# Patient Record
Sex: Male | Born: 1970 | Race: White | Hispanic: No | Marital: Single | Smoking: Former smoker
Health system: Southern US, Community
[De-identification: ages and names within clinical notes are randomized; demographics above are authoritative.]

## PROBLEM LIST (undated history)

## (undated) DIAGNOSIS — K602 Anal fissure, unspecified: Secondary | ICD-10-CM

## (undated) DIAGNOSIS — F419 Anxiety disorder, unspecified: Secondary | ICD-10-CM

## (undated) HISTORY — PX: SPHINCTEROTOMY: SHX5279

## (undated) HISTORY — DX: Anal fissure, unspecified: K60.2

## (undated) HISTORY — PX: CYST REMOVAL NECK: SHX6281

---

## 2002-08-20 ENCOUNTER — Ambulatory Visit (HOSPITAL_BASED_OUTPATIENT_CLINIC_OR_DEPARTMENT_OTHER): Admission: RE | Admit: 2002-08-20 | Discharge: 2002-08-20 | Payer: Self-pay | Admitting: *Deleted

## 2002-08-20 ENCOUNTER — Encounter (INDEPENDENT_AMBULATORY_CARE_PROVIDER_SITE_OTHER): Payer: Self-pay | Admitting: Specialist

## 2010-09-06 ENCOUNTER — Ambulatory Visit (HOSPITAL_BASED_OUTPATIENT_CLINIC_OR_DEPARTMENT_OTHER): Admission: RE | Admit: 2010-09-06 | Discharge: 2010-09-06 | Payer: Self-pay | Admitting: General Surgery

## 2011-02-28 LAB — POCT HEMOGLOBIN-HEMACUE: Hemoglobin: 16.2 g/dL (ref 13.0–17.0)

## 2011-05-03 NOTE — Op Note (Signed)
   NAME:  [name], [name]                         ACCOUNT NO.:  [id number]   MEDICAL RECORD NO.:  [id number]                   PATIENT TYPE:  AMB   LOCATION:  DSC                                  FACILITY:  [facility]   PHYSICIAN:  Kathy Breach, M.D.                   DATE OF BIRTH:  09/15/1971   DATE OF PROCEDURE:  DATE OF DISCHARGE:  08/20/2002                                 OPERATIVE REPORT   PREOPERATIVE DIAGNOSIS:  Three cm posterior cervical midline subcutaneous  lipoma.   OPERATIVE PROCEDURE:  Excision of posterior cervical midline 3 to 4  sebaceous cyst.   POSTOPERATIVE DIAGNOSIS:  Pending histological confirmation.   DESCRIPTION OF PROCEDURE:  With the patient under general orotracheal  anesthesia he was placed in the prone position. The posterior neck and upper  back were prepped and draped in sterile fashion.  The patient had a freely  mobile, fairly soft 3+ cm subcutaneous mass in the midline of the neck  overlying approximately C6.  A horizontal incision in the skin lines 4 to 5  cm was made over the mid portion of the mass.  The skin incision was through  the very thick posterior cervical skin and immediately ruptured into an  inflammatory sebaceous cyst.  With sharp dissection entire cyst was  dissected from the surrounding tissues.  There was a fair amount of fibrotic  inflammatory response adjacent to the cyst wall and multiple bleeders  controlled with electrocautery and 4-0 silk ties as needed. With the cyst  entirely excised the wound was irrigated diffusely.  Complete hemostasis and  suction using cauterization. The deep layer was closed with 3-0 chromic  catgut suture.  The subcutaneous tissues were closed with interrupted 3-0  chromic catgut sutures.  The skin was approximated with a running 4-0 nylon  suture. The skin margins were painted with Benzoin and Steri-Strips were  applied.  Blood loss for the procedure was estimated at 150 cc.  The patient  tolerated  the procedure well and was taken to the recovery room in stable,  general condition.                                               Kathy Breach, M.D.    Venia Minks  D:  08/20/2002  T:  08/22/2002  Job:  [id number]

## 2012-03-26 ENCOUNTER — Other Ambulatory Visit: Payer: Self-pay | Admitting: Family Medicine

## 2012-03-26 DIAGNOSIS — N50819 Testicular pain, unspecified: Secondary | ICD-10-CM

## 2012-03-30 ENCOUNTER — Other Ambulatory Visit: Payer: Self-pay

## 2012-04-02 ENCOUNTER — Ambulatory Visit
Admission: RE | Admit: 2012-04-02 | Discharge: 2012-04-02 | Disposition: A | Discharge status: Home / Self Care | Payer: BC Managed Care – PPO | Source: Ambulatory Visit | Attending: Family Medicine | Admitting: Family Medicine

## 2012-04-02 DIAGNOSIS — N50819 Testicular pain, unspecified: Secondary | ICD-10-CM

## 2012-05-19 ENCOUNTER — Other Ambulatory Visit (HOSPITAL_COMMUNITY): Payer: Self-pay | Admitting: Neurosurgery

## 2012-05-19 DIAGNOSIS — M5126 Other intervertebral disc displacement, lumbar region: Secondary | ICD-10-CM

## 2012-05-22 ENCOUNTER — Ambulatory Visit (HOSPITAL_COMMUNITY)
Admission: RE | Admit: 2012-05-22 | Discharge: 2012-05-22 | Disposition: A | Discharge status: Home / Self Care | Payer: BC Managed Care – PPO | Source: Ambulatory Visit | Attending: Neurosurgery | Admitting: Neurosurgery

## 2012-05-22 DIAGNOSIS — M545 Low back pain, unspecified: Secondary | ICD-10-CM | POA: Insufficient documentation

## 2012-05-22 DIAGNOSIS — M5126 Other intervertebral disc displacement, lumbar region: Secondary | ICD-10-CM | POA: Insufficient documentation

## 2014-02-27 ENCOUNTER — Encounter (HOSPITAL_BASED_OUTPATIENT_CLINIC_OR_DEPARTMENT_OTHER): Payer: Self-pay | Admitting: Emergency Medicine

## 2014-02-27 ENCOUNTER — Emergency Department (HOSPITAL_BASED_OUTPATIENT_CLINIC_OR_DEPARTMENT_OTHER)
Admission: EM | Admit: 2014-02-27 | Discharge: 2014-02-27 | Disposition: A | Discharge status: Home / Self Care | Payer: Worker's Compensation | Attending: Emergency Medicine | Admitting: Emergency Medicine

## 2014-02-27 DIAGNOSIS — S139XXA Sprain of joints and ligaments of unspecified parts of neck, initial encounter: Secondary | ICD-10-CM | POA: Insufficient documentation

## 2014-02-27 DIAGNOSIS — F411 Generalized anxiety disorder: Secondary | ICD-10-CM | POA: Insufficient documentation

## 2014-02-27 DIAGNOSIS — Z79899 Other long term (current) drug therapy: Secondary | ICD-10-CM | POA: Insufficient documentation

## 2014-02-27 DIAGNOSIS — Z87891 Personal history of nicotine dependence: Secondary | ICD-10-CM | POA: Insufficient documentation

## 2014-02-27 DIAGNOSIS — Y929 Unspecified place or not applicable: Secondary | ICD-10-CM | POA: Insufficient documentation

## 2014-02-27 DIAGNOSIS — Z880 Allergy status to penicillin: Secondary | ICD-10-CM | POA: Insufficient documentation

## 2014-02-27 DIAGNOSIS — M542 Cervicalgia: Secondary | ICD-10-CM | POA: Diagnosis present

## 2014-02-27 DIAGNOSIS — S161XXA Strain of muscle, fascia and tendon at neck level, initial encounter: Secondary | ICD-10-CM

## 2014-02-27 DIAGNOSIS — IMO0002 Reserved for concepts with insufficient information to code with codable children: Secondary | ICD-10-CM | POA: Insufficient documentation

## 2014-02-27 DIAGNOSIS — Y939 Activity, unspecified: Secondary | ICD-10-CM | POA: Insufficient documentation

## 2014-02-27 HISTORY — DX: Anxiety disorder, unspecified: F41.9

## 2014-02-27 MED ORDER — CYCLOBENZAPRINE HCL 5 MG PO TABS: 5.0000 mg | ORAL_TABLET | Freq: Three times a day (TID) | ORAL | Status: DC | PRN

## 2014-02-27 MED ORDER — NAPROXEN 500 MG PO TABS: 500.0000 mg | ORAL_TABLET | Freq: Two times a day (BID) | ORAL | Status: DC
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
## 2014-02-27 NOTE — Discharge Instructions (Signed)
Cervical Sprain A cervical sprain is an injury in the neck in which the strong, fibrous tissues (ligaments) that connect your neck bones stretch or tear. Cervical sprains can range from mild to severe. Severe cervical sprains can cause the neck vertebrae to be unstable. This can lead to damage of the spinal cord and can result in serious nervous system problems. The amount of time it takes for a cervical sprain to get better depends on the cause and extent of the injury. Most cervical sprains heal in 1 to 3 weeks. CAUSES  Severe cervical sprains may be caused by:   Contact sport injuries (such as from football, rugby, wrestling, hockey, auto racing, gymnastics, diving, martial arts, or boxing).   Motor vehicle collisions.   Whiplash injuries. This is an injury from a sudden forward-and backward whipping movement of the head and neck.  Falls.  Mild cervical sprains may be caused by:   Being in an awkward position, such as while cradling a telephone between your ear and shoulder.   Sitting in a chair that does not offer proper support.   Working at a poorly designed computer station.   Looking up or down for long periods of time.  SYMPTOMS   Pain, soreness, stiffness, or a burning sensation in the front, back, or sides of the neck. This discomfort may develop immediately after the injury or slowly, 24 hours or more after the injury.   Pain or tenderness directly in the middle of the back of the neck.   Shoulder or upper back pain.   Limited ability to move the neck.   Headache.   Dizziness.   Weakness, numbness, or tingling in the hands or arms.   Muscle spasms.   Difficulty swallowing or chewing.   Tenderness and swelling of the neck.  DIAGNOSIS  Most of the time your health care provider can diagnose a cervical sprain by taking your history and doing a physical exam. Your health care provider will ask about previous neck injuries and any known neck  problems, such as arthritis in the neck. X-rays may be taken to find out if there are any other problems, such as with the bones of the neck. Other tests, such as a CT scan or MRI, may also be needed.  TREATMENT  Treatment depends on the severity of the cervical sprain. Mild sprains can be treated with rest, keeping the neck in place (immobilization), and pain medicines. Severe cervical sprains are immediately immobilized. Further treatment is done to help with pain, muscle spasms, and other symptoms and may include:  Medicines, such as pain relievers, numbing medicines, or muscle relaxants.   Physical therapy. This may involve stretching exercises, strengthening exercises, and posture training. Exercises and improved posture can help stabilize the neck, strengthen muscles, and help stop symptoms from returning.  HOME CARE INSTRUCTIONS   Put ice on the injured area.   Put ice in a plastic bag.   Place a towel between your skin and the bag.   Leave the ice on for 15 20 minutes, 3 4 times a day.   If your injury was severe, you may have been given a cervical collar to wear. A cervical collar is a two-piece collar designed to keep your neck from moving while it heals.  Do not remove the collar unless instructed by your health care provider.  If you have long hair, keep it outside of the collar.  Ask your health care provider before making any adjustments to your collar.   Minor adjustments may be required over time to improve comfort and reduce pressure on your chin or on the back of your head.  Ifyou are allowed to remove the collar for cleaning or bathing, follow your health care provider's instructions on how to do so safely.  Keep your collar clean by wiping it with mild soap and water and drying it completely. If the collar you have been given includes removable pads, remove them every 1 2 days and hand wash them with soap and water. Allow them to air dry. They should be completely  dry before you wear them in the collar.  If you are allowed to remove the collar for cleaning and bathing, wash and dry the skin of your neck. Check your skin for irritation or sores. If you see any, tell your health care provider.  Do not drive while wearing the collar.   Only take over-the-counter or prescription medicines for pain, discomfort, or fever as directed by your health care provider.   Keep all follow-up appointments as directed by your health care provider.   Keep all physical therapy appointments as directed by your health care provider.   Make any needed adjustments to your workstation to promote good posture.   Avoid positions and activities that make your symptoms worse.   Warm up and stretch before being active to help prevent problems.  SEEK MEDICAL CARE IF:   Your pain is not controlled with medicine.   You are unable to decrease your pain medicine over time as planned.   Your activity level is not improving as expected.  SEEK IMMEDIATE MEDICAL CARE IF:   You develop any bleeding.  You develop stomach upset.  You have signs of an allergic reaction to your medicine.   Your symptoms get worse.   You develop new, unexplained symptoms.   You have numbness, tingling, weakness, or paralysis in any part of your body.  MAKE SURE YOU:   Understand these instructions.  Will watch your condition.  Will get help right away if you are not doing well or get worse. Document Released: 09/29/2007 Document Revised: 09/22/2013 Document Reviewed: 06/09/2013 ExitCare Patient Information 2014 [employer].  

## 2014-05-19 ENCOUNTER — Emergency Department (HOSPITAL_BASED_OUTPATIENT_CLINIC_OR_DEPARTMENT_OTHER)
Admission: EM | Admit: 2014-05-19 | Discharge: 2014-05-19 | Disposition: A | Discharge status: Home / Self Care | Payer: Worker's Compensation | Attending: Emergency Medicine | Admitting: Emergency Medicine

## 2014-05-19 ENCOUNTER — Encounter (HOSPITAL_BASED_OUTPATIENT_CLINIC_OR_DEPARTMENT_OTHER): Payer: Self-pay | Admitting: Emergency Medicine

## 2014-05-19 DIAGNOSIS — Y9229 Other specified public building as the place of occurrence of the external cause: Secondary | ICD-10-CM | POA: Insufficient documentation

## 2014-05-19 DIAGNOSIS — Z87891 Personal history of nicotine dependence: Secondary | ICD-10-CM | POA: Insufficient documentation

## 2014-05-19 DIAGNOSIS — IMO0002 Reserved for concepts with insufficient information to code with codable children: Secondary | ICD-10-CM | POA: Insufficient documentation

## 2014-05-19 DIAGNOSIS — F411 Generalized anxiety disorder: Secondary | ICD-10-CM | POA: Insufficient documentation

## 2014-05-19 DIAGNOSIS — Z79899 Other long term (current) drug therapy: Secondary | ICD-10-CM | POA: Insufficient documentation

## 2014-05-19 DIAGNOSIS — S39012A Strain of muscle, fascia and tendon of lower back, initial encounter: Secondary | ICD-10-CM

## 2014-05-19 DIAGNOSIS — Z791 Long term (current) use of non-steroidal anti-inflammatories (NSAID): Secondary | ICD-10-CM | POA: Insufficient documentation

## 2014-05-19 DIAGNOSIS — Y9389 Activity, other specified: Secondary | ICD-10-CM | POA: Insufficient documentation

## 2014-05-19 DIAGNOSIS — X500XXA Overexertion from strenuous movement or load, initial encounter: Secondary | ICD-10-CM | POA: Insufficient documentation

## 2014-05-19 MED ORDER — CYCLOBENZAPRINE HCL 10 MG PO TABS: 10.0000 mg | ORAL_TABLET | Freq: Three times a day (TID) | ORAL | Status: DC | PRN

## 2014-05-19 NOTE — Discharge Instructions (Signed)
Ibuprofen 600 mg 3 times daily for the next 5 days. Flexeril as needed for pain not relieved with ibuprofen.  Followup with your primary Dr. if not improving in the next week, and return to the emergency department if your symptoms significantly worsen or if you develop any new and concerning symptoms.   Back Pain, Adult Low back pain is very common. About 1 in 5 people have back pain.The cause of low back pain is rarely dangerous. The pain often gets better over time.About half of people with a sudden onset of back pain feel better in just 2 weeks. About 8 in 10 people feel better by 6 weeks.  CAUSES Some common causes of back pain include:  Strain of the muscles or ligaments supporting the spine.  Wear and tear (degeneration) of the spinal discs.  Arthritis.  Direct injury to the back. DIAGNOSIS Most of the time, the direct cause of low back pain is not known.However, back pain can be treated effectively even when the exact cause of the pain is unknown.Answering your caregiver's questions about your overall health and symptoms is one of the most accurate ways to make sure the cause of your pain is not dangerous. If your caregiver needs more information, he or she may order lab work or imaging tests (X-rays or MRIs).However, even if imaging tests show changes in your back, this usually does not require surgery. HOME CARE INSTRUCTIONS For many people, back pain returns.Since low back pain is rarely dangerous, it is often a condition that people can learn to [facility] their own.   Remain active. It is stressful on the back to sit or stand in one place. Do not sit, drive, or stand in one place for more than 30 minutes at a time. Take short walks on level surfaces as soon as pain allows.Try to increase the length of time you walk each day.  Do not stay in bed.Resting more than 1 or 2 days can delay your recovery.  Do not avoid exercise or work.Your body is made to move.It is not  dangerous to be active, even though your back may hurt.Your back will likely heal faster if you return to being active before your pain is gone.  Pay attention to your body when you bend and lift. Many people have less discomfortwhen lifting if they bend their knees, keep the load close to their bodies,and avoid twisting. Often, the most comfortable positions are those that put less stress on your recovering back.  Find a comfortable position to sleep. Use a firm mattress and lie on your side with your knees slightly bent. If you lie on your back, put a pillow under your knees.  Only take over-the-counter or prescription medicines as directed by your caregiver. Over-the-counter medicines to reduce pain and inflammation are often the most helpful.Your caregiver may prescribe muscle relaxant drugs.These medicines help dull your pain so you can more quickly return to your normal activities and healthy exercise.  Put ice on the injured area.  Put ice in a plastic bag.  Place a towel between your skin and the bag.  Leave the ice on for 15-20 minutes, 03-04 times a day for the first 2 to 3 days. After that, ice and heat may be alternated to reduce pain and spasms.  Ask your caregiver about trying back exercises and gentle massage. This may be of some benefit.  Avoid feeling anxious or stressed.Stress increases muscle tension and can worsen back pain.It is important to recognize when  you are anxious or stressed and learn ways to manage it.Exercise is a great option. SEEK MEDICAL CARE IF:  You have pain that is not relieved with rest or medicine.  You have pain that does not improve in 1 week.  You have new symptoms.  You are generally not feeling well. SEEK IMMEDIATE MEDICAL CARE IF:   You have pain that radiates from your back into your legs.  You develop new bowel or bladder control problems.  You have unusual weakness or numbness in your arms or legs.  You develop nausea or  vomiting.  You develop abdominal pain.  You feel faint. Document Released: 12/02/2005 Document Revised: 06/02/2012 Document Reviewed: 04/22/2011 [facility] Patient Information 2014 [place], [place].
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
## 2015-01-16 ENCOUNTER — Encounter (HOSPITAL_BASED_OUTPATIENT_CLINIC_OR_DEPARTMENT_OTHER): Payer: Self-pay | Admitting: *Deleted

## 2015-01-16 ENCOUNTER — Emergency Department (HOSPITAL_BASED_OUTPATIENT_CLINIC_OR_DEPARTMENT_OTHER): Payer: Worker's Compensation

## 2015-01-16 ENCOUNTER — Emergency Department (HOSPITAL_BASED_OUTPATIENT_CLINIC_OR_DEPARTMENT_OTHER)
Admission: EM | Admit: 2015-01-16 | Discharge: 2015-01-16 | Disposition: A | Discharge status: Home / Self Care | Payer: Worker's Compensation | Attending: Emergency Medicine | Admitting: Emergency Medicine

## 2015-01-16 DIAGNOSIS — F419 Anxiety disorder, unspecified: Secondary | ICD-10-CM | POA: Diagnosis not present

## 2015-01-16 DIAGNOSIS — M5136 Other intervertebral disc degeneration, lumbar region: Secondary | ICD-10-CM | POA: Diagnosis not present

## 2015-01-16 DIAGNOSIS — T148XXA Other injury of unspecified body region, initial encounter: Secondary | ICD-10-CM

## 2015-01-16 DIAGNOSIS — Z79899 Other long term (current) drug therapy: Secondary | ICD-10-CM | POA: Diagnosis not present

## 2015-01-16 DIAGNOSIS — Y9389 Activity, other specified: Secondary | ICD-10-CM | POA: Diagnosis not present

## 2015-01-16 DIAGNOSIS — S39012A Strain of muscle, fascia and tendon of lower back, initial encounter: Secondary | ICD-10-CM | POA: Diagnosis not present

## 2015-01-16 DIAGNOSIS — Y990 Civilian activity done for income or pay: Secondary | ICD-10-CM | POA: Diagnosis not present

## 2015-01-16 DIAGNOSIS — S3992XA Unspecified injury of lower back, initial encounter: Secondary | ICD-10-CM | POA: Diagnosis present

## 2015-01-16 DIAGNOSIS — Z87891 Personal history of nicotine dependence: Secondary | ICD-10-CM | POA: Diagnosis not present

## 2015-01-16 DIAGNOSIS — Y9289 Other specified places as the place of occurrence of the external cause: Secondary | ICD-10-CM | POA: Diagnosis not present

## 2015-01-16 MED ORDER — CYCLOBENZAPRINE HCL 5 MG PO TABS: 5.0000 mg | ORAL_TABLET | Freq: Two times a day (BID) | ORAL | Status: DC | PRN

## 2015-01-16 MED ORDER — HYDROCODONE-ACETAMINOPHEN 5-325 MG PO TABS: 1.0000 | ORAL_TABLET | ORAL | Status: DC | PRN
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
## 2015-01-16 NOTE — Discharge Instructions (Signed)
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
## 2018-09-11 ENCOUNTER — Other Ambulatory Visit: Payer: Self-pay | Admitting: Family Medicine

## 2018-09-11 ENCOUNTER — Ambulatory Visit
Admission: RE | Admit: 2018-09-11 | Discharge: 2018-09-11 | Disposition: A | Discharge status: Home / Self Care | Payer: BC Managed Care – PPO | Source: Ambulatory Visit | Attending: Family Medicine | Admitting: Family Medicine

## 2018-09-11 DIAGNOSIS — R319 Hematuria, unspecified: Secondary | ICD-10-CM

## 2018-09-11 DIAGNOSIS — R1031 Right lower quadrant pain: Secondary | ICD-10-CM

## 2018-10-05 ENCOUNTER — Telehealth: Payer: Self-pay | Admitting: Hematology and Oncology

## 2018-10-05 NOTE — Telephone Encounter (Signed)
New referral received from Dr. Leodis Sias for macrocytosis. Pt has been scheduled to see Dr. Caron Presume on 10/23 at 230pm. Pt aware to arrive 30 minutes early.

## 2018-10-06 NOTE — Progress Notes (Signed)
[facility] Outpatient Hematology/Oncology Initial Consultation  Patient Name:  Anthony Kline  DOB: 12-23-1970   Date of Service: October 07, 2018  Referring Provider: Leodis Sias, MD [address] [place], [place] [address]   Consulting Physician: Toni Arthurs, MD Hematology/Oncology  Reason for Referral: In the setting of an elevated MCV without anemia, he presents now for further diagnostic and therapeutic recommendations.  History Present Illness: Anthony Kline he is a 47 year old resident of [place] whose past medical history is significant for hypercholesterolemia; lower back pain with sciatica; attention deficit disorder; mild anxiety disorder; infrequent bleeding hemorrhoids; erectile dysfunction; a previous sphincterotomy for anal fissure nearly 10 years ago; tendinitis of the right elbow; allergic rhinitis; renal calculi; and previous alcohol dependence.  His primary care physician is Dr. Leodis Sias.  He is alone at this first visit.  Because of right-sided inguinal pain, on September 11, 2018 he underwent a CT of the abdomen and pelvis without intravenous contrast.  There was fullness identified within the right renal collecting system and right ureter without definite ureteral stone.  Those changes appear to be secondary from edema from a recently passed stone.  There was a 1 mm nonobstructing left renal calculi.  No other abnormality was noted.  Those results are detailed below.  In the process of his evaluation, a complete blood count was obtained.  The results of that complete blood count from September 27 revealed hemoglobin 15.0 hematocrit 43.5 MCV 102.1 MCH 35.3 WBC 6.2 with 64% neutrophils 29% lymphocytes 7% monocytes 1% basophil; platelets 311,000.  Anthony Kline was previously unaware of an elevated MCV which generally corresponds to an increase in size of his red blood cells.  He is a reformed alcoholic.  His last consumption of alcoholic beverages was  11 years ago.  There is no blood disorder or bleeding tendency in his immediate family.  He does not eat red meat or chicken.  He does however consume seafood.  In addition to his standard medications, he takes a multivitamin, fish oil and vitamin B complex.  He admits to infrequent bright red blood when wiping.  There is no blood mixed with stool.  This happens a few times yearly.  In childhood he had a colonoscopy for "stomach problems."  He admits to having internal hemorrhoids.  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  He has no children.  He stopped smoking 2 years ago. He has no diabetes mellitus, primary hypertension, coronary artery disease, angina, or cardiac dysrhythmia.  He denies seizure disorder or stroke syndrome.  He has no thyroid disease.  There is no gastroesophageal reflux or peptic ulcer disease.  He denies viral hepatitis, inflammatory bowel disease, or diverticulosis.  He has no rheumatoid or gouty arthritis.  He denies peripheral arterial or venous thromboembolic disease.  He denies any appetite or weight deficit.  He has occasional positional dizziness.    There is no unusual headache, lightheadedness, syncope, or near syncopal episode. There is no visual changes or hearing deficit.  He denies any rash or itching.  He has no unusual cough, sore throat, orthopnea.  He denies pain or difficulty in swallowing.  No fever, shaking chills, sweats, or flulike symptoms are evident.  He has no heartburn or indigestion.  No nausea, vomiting, diarrhea, or constipation are reported.  He moves his bowels every other day.  He denies melena or bright red blood per rectum.  He has very infrequent bleeding hemorrhoids.  There  is no urinary frequency, urgency, hematuria, or dysuria.  He denies swelling of his ankles.  There is no chest or abdominal pain.  He has no focal areas of bone, joint, or muscle pain.  He has no epistaxis or hemoptysis.  He reports no numbness or  tingling in the fingers or toes.  It is with this background he presents now for further diagnostic and therapeutic recommendations in the setting of an elevated MCV without anemia.  Past Medical History:  Diagnosis Date  . Anxiety   Hypercholesterolemia Antiretroviral prophylaxis (HIV) Renal calculi Chronic depression without suicidal ideation Anxiety disorder Erectile dysfunction Tendinitis of the right elbow Internal hemorrhoids Recovered alcoholic  Surgical History: Bilateral sphincterotomy for anal fissure 10 years ago.  Family History: Mother: Age 42 years: DM/advanced breast cancer. Father: Age 53 years: DM/CAD He has no brothers. He has 1 sister without major medical problems.  Social History   Socioeconomic History  . Marital status: Single    Spouse name: Not on file  . Number of children: Not on file  . Years of education: Not on file  . Highest education level: Not on file  Occupational History  . Not on file  Social Needs  . Financial resource strain: Not on file  . Food insecurity:    Worry: Not on file    Inability: Not on file  . Transportation needs:    Medical: Not on file    Non-medical: Not on file  Tobacco Use  . Smoking status: Former Smoker  Substance and Sexual Activity  . Alcohol use: No  . Drug use: No  . Sexual activity: Not on file  Lifestyle  . Physical activity:    Days per week: Not on file    Minutes per session: Not on file  . Stress: Not on file  Relationships  . Social connections:    Talks on phone: Not on file    Gets together: Not on file    Attends religious service: Not on file    Active member of club or organization: Not on file    Attends meetings of clubs or organizations: Not on file    Relationship status: Not on file  . Intimate partner violence:    Fear of current or ex partner: Not on file    Emotionally abused: Not on file    Physically abused: Not on file    Forced sexual activity: Not on file  Other  Topics Concern  . Not on file  Social History Narrative  . Not on file  Anthony Kline is single, never married. He has no children. He is an Scientist, research (physical sciences) school principal. His alcohol consumption began at the age of 21 years. He drank heavily until age 67 years. He generally consumed wine and hard liquor. He began smoking the age of 18 years. He generally smoked 1/2 pack of cigarettes daily. When drinking heavily, he smoked 2 packs of cigarettes daily. He stopped smoking at the age of 8 years. He used e-cigarettes on a short-term basis for 1 year. He reports no recreational drug use. He has no significant use of pipe, cigars, or chewing tobacco.  Transfusion History: No prior transfusion  Exposure History: He has no known exposure to toxic chemicals, radiation, or pesticides.  Allergies  Allergen Reactions  . Augmentin [Amoxicillin-Pot Clavulanate]   No food or seasonal allergies  Current Outpatient Medications on File Prior to Visit  Medication Sig  . Amphetamine-Dextroamphetamine (ADDERALL PO) Take 20 mg by  mouth. Patient reports taking 1/4 of 20mg  tablet.  Marland Kitchen b complex vitamins tablet Take 1 tablet by mouth daily.  . Emtricitabine-Tenofovir DF (TRUVADA PO) Take by mouth.  . escitalopram (LEXAPRO) 10 MG tablet Take 10 mg by mouth daily.  Marland Kitchen FIBER PO Take by mouth.  . Multiple Vitamin (MULTIVITAMIN) tablet Take 1 tablet by mouth daily.  . Omega-3 Fatty Acids (FISH OIL PO) Take by mouth.  . Tadalafil (CIALIS PO) Take by mouth.   No current facility-administered medications on file prior to visit.    Review of Systems: Constitutional: No fever, sweats, or shaking chills.  No appetite or weight deficit. Skin: No rash, scaling, sores, lumps, or jaundice. HEENT: No visual changes or hearing deficit. Pulmonary: No unusual cough, sore throat, or orthopnea.  No DOE; former smoker. Cardiovascular: No coronary artery disease, angina, or myocardial infarction.  No cardiac  dysrhythmia, essential hypertension; dyslipidemia. Gastrointestinal: No indigestion, dysphagia, abdominal pain, diarrhea, or constipation.  No change in bowel habits.  Infrequent bleeding hemorrhoids; no melena.  No nausea or vomiting.  No viral hepatitis. Genitourinary: No urinary frequency, urgency, hematuria, or dysuria; erectile dysfunction; HIV prophylaxis; renal calculi. Musculoskeletal: Right elbow tendinitis.  No arthralgias or myalgias; no joint swelling, pain, or instability. Hematologic: No bleeding tendency or easy bruisability; elevated MCV Endocrine: No intolerance to heat or cold; no thyroid disease or diabetes mellitus. Vascular: No peripheral arterial or venous thromboembolic disease. Psychological: Mild anxiety disorder and chronic depression without suicidal ideation.  Reformed alcoholic; former tobacco dependence; no mood changes. Neurological: Occasional positional dizziness.  No lightheadedness, syncope, or near syncopal episodes; no numbness or tingling in the fingers or toes.  Physical Examination: Vital Signs: Body surface area is 1.9 meters squared.  Vitals:   10/07/18 1411  BP: 122/69  Pulse: 61  Resp: 17  Temp: 98.2 F (36.8 C)  SpO2: 98%    Filed Weights   10/07/18 1411  Weight: 159 lb 6.4 oz (72.3 kg)  ECOG PERFORMANCE STATUS: 0 Constitutional:  Alif Petrak is fully nourished and developed.  He looks age appropriate.  He is friendly and cooperative without respiratory compromise at rest. Skin: No rashes, scaling, dryness, jaundice, or itching. HEENT: Head is normocephalic and atraumatic.  Pupils are equal round and reactive to light and accommodation.  Sclerae are anicteric.  Conjunctivae are pink.  No sinus tenderness nor oropharyngeal lesions.  Lips without cracking or peeling; tongue without mass, inflammation, or nodularity.  Mucous membranes are moist. Neck: Supple and symmetric.  No jugular venous distention or thyromegaly.  Trachea is  midline. Lymphatics: No cervical or supraclavicular lymphadenopathy.  No epitrochlear, axillary, or inguinal lymphadenopathy is appreciated. Respiratory/chest: Thorax is symmetrical.  Breath sounds are clear to auscultation and percussion.  Normal excursion and respiratory effort. Back: Symmetric without deformity or tenderness. Cardiovascular: Heart rate and rhythm are regular without murmurs or gallops. Gastrointestinal: Abdomen is soft, nontender; no organomegaly.  Bowel sounds are normoactive.  No masses are appreciated. Genitourinary: Normal external male genitalia. Rectal examination: Not performed. Extremities: In the lower extremities, there is no asymmetric swelling, erythema, tenderness, or cord formation.  No clubbing, cyanosis, nor edema. Hematologic: No petechiae, hematomas, or ecchymoses. Psychological:  He is oriented to person, place, and time; normal affect, memory, and cognition. Neurological: There are no gross neurologic deficits.  Laboratory Results: I have reviewed the data as listed: October 07 2018  Ref Range & Units 16:09 69yr ago  WBC Count 4.0 - 10.5 K/uL 7.6    RBC 4.22 -  5.81 MIL/uL 3.87Low     Hemoglobin 13.0 - 17.0 g/dL 91.4  78.2   HCT 95.6 - 52.0 % 38.6Low     MCV 80.0 - 100.0 fL 99.7    MCH 26.0 - 34.0 pg 35.4High     MCHC 30.0 - 36.0 g/dL 21.3    RDW 08.6 - 57.8 % 12.4    Platelet Count 150 - 400 K/uL 291    nRBC 0.0 - 0.2 % 0.0    Neutrophils Relative % % 59    Neutro Abs 1.7 - 7.7 K/uL 4.4    Lymphocytes Relative % 34    Lymphs Abs 0.7 - 4.0 K/uL 2.6    Monocytes Relative % 7    Monocytes Absolute 0.1 - 1.0 K/uL 0.5    Eosinophils Relative % 0    Eosinophils Absolute 0.0 - 0.5 K/uL 0.0    Basophils Relative % 0    Basophils Absolute 0.0 - 0.1 K/uL 0.0    Immature Granulocytes % 0    Abs Immature Granulocytes 0.00 - 0.07 K/uL 0.03     Diagnostic/Imaging Studies: September 11, 2018 CT ABDOMEN AND PELVIS WITHOUT  CONTRAST  TECHNIQUE: Multidetector CT imaging of the abdomen and pelvis was performed following the standard protocol without IV contrast.  COMPARISON:  None.  FINDINGS: Lower chest: No acute abnormality.  Hepatobiliary: No focal liver abnormality is seen. No gallstones, gallbladder wall thickening, or biliary dilatation.  Pancreas: Unremarkable. No pancreatic ductal dilatation or surrounding inflammatory changes.  Spleen: Normal in size without focal abnormality.  Adrenals/Urinary Tract: Adrenal glands are within normal limits. The kidneys are well visualized bilaterally. A tiny nonobstructing stone is noted in the lower pole of the left kidney. No right renal calculi are noted. Fullness of the right renal collecting system and proximal right ureter is noted. No right ureteral stone is seen. The bladder is well distended and within normal limits.  Stomach/Bowel: Redundant colon is identified. The appendix is well visualized. A tiny appendicolith is noted within. No significant appendiceal dilatation or periappendiceal inflammatory changes are noted. Small bowel is within normal limits. Stomach is unremarkable.  Vascular/Lymphatic: No significant vascular findings are present. No enlarged abdominal or pelvic lymph nodes.  Reproductive: Prostate is unremarkable.  Other: No abdominal wall hernia or abnormality. No abdominopelvic ascites.  Musculoskeletal: Mild degenerative changes of lumbar spine are noted. No acute abnormality is seen.  IMPRESSION: Fullness of the right renal collecting system and right ureter without definitive ureteral stone. These changes may be related to edema from recently passed stone.  Tiny 1 mm nonobstructing left renal stone.  No other focal abnormality is noted.  Anthony Kline M.D. 09/11/2018 13:45  Summary/Assessment: In the setting of an elevated MCV without anemia, he presents now for further diagnostic and  therapeutic recommendations.  Because of right-sided inguinal pain, on September 11, 2018 he underwent a CT of the abdomen and pelvis without intravenous contrast.  There was fullness identified within the right renal collecting system and right ureter without definite ureteral stone.  Those changes appear to be secondary from edema from a recently passed stone.  There was a 1 mm nonobstructing left renal calculi.  No other abnormality was noted.  Those results are detailed below.  In the process of his evaluation, a complete blood count was obtained.  The results of that complete blood count from September 27 revealed hemoglobin 15.0 hematocrit 43.5 MCV 102.1 MCH 35.3 WBC 6.2 with 64% neutrophils 29% lymphocytes 7% monocytes 1% basophil;  platelets 311,000.  Anthony Kline was previously unaware of an elevated MCV which generally corresponds to an increase in size of his red blood cells.  He is a reformed alcoholic.  His last consumption of alcoholic beverages was 11 years ago.  There is no blood disorder or bleeding tendency in his immediate family.  He does not eat red meat or chicken.  He does however consume seafood.  In addition to his standard medications, he takes a multivitamin, fish oil and vitamin B complex.  He admits to infrequent bright red blood when wiping.  There is no blood mixed with stool.  This happens a few times yearly.  In childhood he had a colonoscopy for "stomach problems."  He admits to having internal hemorrhoids.  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  He has no children.  He stopped smoking 2 years ago. He has no diabetes mellitus, primary hypertension, coronary artery disease, angina, or cardiac dysrhythmia.  He denies seizure disorder or stroke syndrome.  He has no thyroid disease.  There is no gastroesophageal reflux or peptic ulcer disease.  He denies viral hepatitis, inflammatory bowel disease, or diverticulosis.  He has no rheumatoid or gouty  arthritis.  He denies peripheral arterial or venous thromboembolic disease.  He denies any appetite or weight deficit.  He has occasional positional dizziness.  There is no unusual headache, lightheadedness, syncope, or near syncopal episode. There is no visual changes or hearing deficit.  He denies any rash or itching.  He has no unusual cough, sore throat, orthopnea.  He denies pain or difficulty in swallowing.  No fever, shaking chills, sweats, or flulike symptoms are evident.  He has no heartburn or indigestion.  No nausea, vomiting, diarrhea, or constipation are reported.  He moves his bowels every other day.  He denies melena or bright red blood per rectum.  He has very infrequent bleeding hemorrhoids.  There is no urinary frequency, urgency, hematuria, or dysuria.  He denies swelling of his ankles.  There is no chest or abdominal pain.  He has no focal areas of bone, joint, or muscle pain.  He has no epistaxis or hemoptysis.  He reports no numbness or tingling in the fingers or toes.   His other comorbid problems include hypercholesterolemia; lower back pain with sciatica; attention deficit disorder; mild anxiety disorder; infrequent bleeding hemorrhoids; erectile dysfunction; a previous sphincterotomy for anal fissure nearly 10 years ago; tendinitis of the right elbow; allergic rhinitis; renal calculi; and previous alcohol dependence.    Recommendation/Plan: I discussed with Anthony Kline the previous laboratory studies from September 27.  We discussed the potential implications which include medication induced, nutritional deficiency, hemolytic process, or metabolic anomaly.  He is not on any new medications in the past 3 months.  Laboratory studies were obtained today to exclude an underlying nutritional deficiency, metabolic abnormality, or hemolytic process.  Some of those results are detailed above.  Barring any unforeseen complications, his next scheduled doctor visit to discuss those results is on  October 21, 2018.  He was advised to call us in the interim should any new or untoward problems arise.  The total time spent discussing the previous laboratory studies, rationale and methodology for evaluating an elevated MCV, preliminary considerations, and proposed laboratory studies was 50 minutes.  At least 50% of that time was spent in discussion, reviewing outside records, laboratory evaluation, counseling, and answering questions. All questions were answered to his satisfaction.   This note was dictated  using voice activated technology/software.  Unfortunately, typographical errors are not uncommon, and transcription is subject to mistakes and regrettably misinterpretation.  If necessary, clarification of the above information can be discussed with me at any time.  Thank you Dr. Modesto Charon for allowing my participation in the care of Anthony Kline. I will keep you closely informed as the results of his preliminary laboratory data become available.  Please do not hesitate to call should any questions arise regarding this initial consultation and discussion.  FOLLOW UP: AS DIRECTED   cc:     Leodis Sias, MD   Toni Arthurs, MD  Hematology/Oncology [facility] [address] [place], [place] [address] Office: [phone] Main: [phone]

## 2018-10-07 ENCOUNTER — Inpatient Hospital Stay: Payer: BC Managed Care – PPO | Attending: Hematology and Oncology | Admitting: Hematology and Oncology

## 2018-10-07 ENCOUNTER — Encounter: Payer: Self-pay | Admitting: Hematology and Oncology

## 2018-10-07 ENCOUNTER — Telehealth: Payer: Self-pay | Admitting: Hematology and Oncology

## 2018-10-07 ENCOUNTER — Inpatient Hospital Stay: Payer: BC Managed Care – PPO

## 2018-10-07 VITALS — BP 122/69 | HR 61 | Temp 98.2°F | Resp 17 | Ht 70.5 in | Wt 159.4 lb

## 2018-10-07 DIAGNOSIS — K648 Other hemorrhoids: Secondary | ICD-10-CM | POA: Diagnosis not present

## 2018-10-07 DIAGNOSIS — R718 Other abnormality of red blood cells: Secondary | ICD-10-CM | POA: Insufficient documentation

## 2018-10-07 DIAGNOSIS — M778 Other enthesopathies, not elsewhere classified: Secondary | ICD-10-CM | POA: Insufficient documentation

## 2018-10-07 DIAGNOSIS — Z87891 Personal history of nicotine dependence: Secondary | ICD-10-CM

## 2018-10-07 DIAGNOSIS — F329 Major depressive disorder, single episode, unspecified: Secondary | ICD-10-CM | POA: Insufficient documentation

## 2018-10-07 DIAGNOSIS — E785 Hyperlipidemia, unspecified: Secondary | ICD-10-CM | POA: Insufficient documentation

## 2018-10-07 DIAGNOSIS — F32A Depression, unspecified: Secondary | ICD-10-CM | POA: Insufficient documentation

## 2018-10-07 DIAGNOSIS — N529 Male erectile dysfunction, unspecified: Secondary | ICD-10-CM | POA: Insufficient documentation

## 2018-10-07 LAB — COMPREHENSIVE METABOLIC PANEL
ALK PHOS: 85 U/L (ref 38–126)
ALT: 21 U/L (ref 0–44)
ANION GAP: 10 (ref 5–15)
AST: 26 U/L (ref 15–41)
Albumin: 4.4 g/dL (ref 3.5–5.0)
BILIRUBIN TOTAL: 1.1 mg/dL (ref 0.3–1.2)
BUN: 14 mg/dL (ref 6–20)
CALCIUM: 9.1 mg/dL (ref 8.9–10.3)
CO2: 26 mmol/L (ref 22–32)
Chloride: 105 mmol/L (ref 98–111)
Creatinine, Ser: 0.86 mg/dL (ref 0.61–1.24)
GFR calc Af Amer: >60 mL/min (ref >60)
Glucose, Bld: 89 mg/dL (ref 70–99)
POTASSIUM: 3.8 mmol/L (ref 3.5–5.1)
Sodium: 141 mmol/L (ref 135–145)
TOTAL PROTEIN: 7.2 g/dL (ref 6.5–8.1)

## 2018-10-07 LAB — RETICULOCYTES
IMMATURE RETIC FRACT: 15.5 % (ref 2.3–15.9)
RBC.: 3.87 MIL/uL — AB (ref 4.22–5.81)
Retic Count, Absolute: 158.7 10*3/uL (ref 19.0–186.0)
Retic Ct Pct: 4.1 % — ABNORMAL HIGH (ref 0.4–3.1)

## 2018-10-07 LAB — DIRECT ANTIGLOBULIN TEST (NOT AT ARMC)
DAT, IgG: NEGATIVE
DAT, complement: NEGATIVE

## 2018-10-07 LAB — CBC WITH DIFFERENTIAL (CANCER CENTER ONLY)
Abs Immature Granulocytes: 0.03 10*3/uL (ref 0.00–0.07)
BASOS PCT: 0 %
Basophils Absolute: 0 10*3/uL (ref 0.0–0.1)
EOS PCT: 0 %
Eosinophils Absolute: 0 10*3/uL (ref 0.0–0.5)
HCT: 38.6 % — ABNORMAL LOW (ref 39.0–52.0)
HEMOGLOBIN: 13.7 g/dL (ref 13.0–17.0)
Immature Granulocytes: 0 %
Lymphocytes Relative: 34 %
Lymphs Abs: 2.6 10*3/uL (ref 0.7–4.0)
MCH: 35.4 pg — AB (ref 26.0–34.0)
MCHC: 35.5 g/dL (ref 30.0–36.0)
MCV: 99.7 fL (ref 80.0–100.0)
Monocytes Absolute: 0.5 10*3/uL (ref 0.1–1.0)
Monocytes Relative: 7 %
Neutro Abs: 4.4 10*3/uL (ref 1.7–7.7)
Neutrophils Relative %: 59 %
PLATELETS: 291 10*3/uL (ref 150–400)
RBC: 3.87 MIL/uL — AB (ref 4.22–5.81)
RDW: 12.4 % (ref 11.5–15.5)
WBC: 7.6 10*3/uL (ref 4.0–10.5)
nRBC: 0 % (ref 0.0–0.2)

## 2018-10-07 LAB — VITAMIN B12: Vitamin B-12: 804 pg/mL (ref 180–914)

## 2018-10-07 LAB — FOLATE: Folate: 29.9 ng/mL (ref >5.9)

## 2018-10-07 NOTE — Patient Instructions (Signed)
We discussed in detail the elevation in MCV.  This is associated with a slight enlargement of red blood cells.  Both your white blood cell and platelet count were normal.  There was no evidence of anemia in September.  Laboratory studies are requested today to exclude underlying nutritional deficiency, metabolic abnormality, or premature destruction of red blood cells which can lead to an elevated MCV.  Barring any unforeseen complications, your next scheduled doctor appointment to discuss those results is on November 6 at 2:30 PM.  Please do not hesitate to call if any questions or problems arise in the future.  Thank you! Debbe Odea, MD Hematology/Oncology

## 2018-10-07 NOTE — Telephone Encounter (Signed)
Appts scheduled avs/calendar declined per 10/23 los

## 2018-10-08 LAB — HAPTOGLOBIN: HAPTOGLOBIN: 40 mg/dL (ref 34–200)

## 2018-10-21 ENCOUNTER — Encounter: Payer: Self-pay | Admitting: Hematology and Oncology

## 2018-10-21 ENCOUNTER — Ambulatory Visit: Payer: BC Managed Care – PPO

## 2018-10-21 ENCOUNTER — Inpatient Hospital Stay: Payer: BC Managed Care – PPO | Attending: Hematology and Oncology | Admitting: Hematology and Oncology

## 2018-10-21 VITALS — BP 133/73 | HR 72 | Temp 98.5°F | Resp 18 | Ht 70.5 in | Wt 163.1 lb

## 2018-10-21 DIAGNOSIS — D649 Anemia, unspecified: Secondary | ICD-10-CM

## 2018-10-21 DIAGNOSIS — F1020 Alcohol dependence, uncomplicated: Secondary | ICD-10-CM

## 2018-10-21 DIAGNOSIS — N529 Male erectile dysfunction, unspecified: Secondary | ICD-10-CM | POA: Diagnosis not present

## 2018-10-21 DIAGNOSIS — E7800 Pure hypercholesterolemia, unspecified: Secondary | ICD-10-CM | POA: Diagnosis not present

## 2018-10-21 DIAGNOSIS — Z79899 Other long term (current) drug therapy: Secondary | ICD-10-CM | POA: Diagnosis not present

## 2018-10-21 DIAGNOSIS — F419 Anxiety disorder, unspecified: Secondary | ICD-10-CM

## 2018-10-21 DIAGNOSIS — Z87442 Personal history of urinary calculi: Secondary | ICD-10-CM

## 2018-10-21 DIAGNOSIS — J309 Allergic rhinitis, unspecified: Secondary | ICD-10-CM

## 2018-10-21 DIAGNOSIS — Z87891 Personal history of nicotine dependence: Secondary | ICD-10-CM | POA: Diagnosis not present

## 2018-10-21 NOTE — Progress Notes (Signed)
Hematology/Oncology Outpatient Progress Note  Patient Name:  Anthony Kline  DOB: 03/21/1971   Date of Service: October 21, 2018  Referring Provider: Leodis Sias, MD [address] [place], [place] [address]   Consulting Physician: Toni Arthurs, MD Hematology/Oncology  Reason for Visit: In the setting of an elevated MCV without anemia initially, he presents now for the results of his preliminary laboratory evaluation and recommendations.    Brief History: Anthony Kline he is a 47 year old resident of [place] whose past medical history is significant for hypercholesterolemia; lower back pain with sciatica; attention deficit disorder; mild anxiety disorder; infrequent bleeding hemorrhoids; erectile dysfunction; a previous sphincterotomy for anal fissure nearly 10 years ago; tendinitis of the right elbow; allergic rhinitis; renal calculi; and previous alcohol dependence.  His primary care physician is Dr. Leodis Sias. He is alone at this visit.  Because of right-sided inguinal pain, on September 11, 2018 he underwent a CT of the abdomen and pelvis without intravenous contrast.  There was fullness identified within the right renal collecting system and right ureter without definite ureteral stone.  Those changes appear to be secondary from edema from a recently passed stone.  There was a 1 mm nonobstructing left renal calculi.  No other abnormality was noted.  Those results are detailed below.  In the process of his evaluation, a complete blood count was obtained.  The results of that complete blood count from September 27 revealed hemoglobin 15.0 hematocrit 43.5 MCV 102.1 MCH 35.3 WBC 6.2 with 64% neutrophils 29% lymphocytes 7% monocytes 1% basophil; platelets 311,000.  Tresean was previously unaware of an elevated MCV which generally corresponds to an increase in size of his red blood cells.  He is a reformed alcoholic.  His last consumption of alcoholic beverages was 11 years ago.   There is no blood disorder or bleeding tendency in his immediate family.  He does not eat red meat or chicken.  He does however consume seafood.  In addition to his standard medications, he takes a multivitamin, fish oil and vitamin B complex.  He admits to infrequent bright red blood when wiping. There is no blood mixed with stool.  This happens a few times yearly.  In childhood he had a colonoscopy for "stomach problems."  He admits to having internal hemorrhoids.  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  He has no children. He stopped smoking 2 years ago.  At the time of his initial visit, we discussed the previous laboratory studies from September 27. We discussed also the potential implications which include medication-induced, nutritional deficiency, hemolytic process, or metabolic anomaly.  He is not on any new medications in the past 3 months.  Although he tends to workout strenuously, he is not working out on a regular basis. It is with this background he presents now for the results of his preliminary laboratory studies and recommendations in the setting of an elevated MCV, now with mild anemia.   Interval History: In the interim since his last visit, he reports no new problems or complaints. He denies any appetite or weight deficit.  He has occasional positional dizziness. There is no unusual headache, lightheadedness, syncope, or near syncopal episode. There is no visual changes or hearing deficit.  He denies any rash or itching.  He has no unusual cough, sore throat, orthopnea.  He denies pain or difficulty in swallowing.  No fever, shaking chills, sweats, or flulike symptoms are evident.  He has no heartburn  or indigestion.  No nausea, vomiting, diarrhea, or constipation are reported.  He moves his bowels every other day.  He denies melena or bright red blood per rectum.  He has very infrequent bleeding hemorrhoids. There is no urinary frequency, urgency,  hematuria, or dysuria.  He denies swelling of his ankles. There is no chest or abdominal pain. He has no focal areas of bone, joint, or muscle pain. He has no epistaxis or hemoptysis.  He reports no numbness or tingling in the fingers or toes.    Past Medical History Reviewed        Family History Reviewed       Social History Reviewed  Past Medical History:  Diagnosis Date  . Anxiety   Hypercholesterolemia Antiretroviral prophylaxis (HIV) Renal calculi Chronic depression without suicidal ideation Anxiety disorder Erectile dysfunction Tendinitis of the right elbow Internal hemorrhoids Recovered alcoholic  Surgical History: Bilateral sphincterotomy for anal fissure 10 years ago.  Allergies  Allergen Reactions  . Augmentin [Amoxicillin-Pot Clavulanate]    Current Outpatient Medications on File Prior to Visit  Medication Sig  . Amphetamine-Dextroamphetamine (ADDERALL PO) Take 20 mg by mouth. Patient reports taking 1/4 of 20mg  tablet.  Marland Kitchen b complex vitamins tablet Take 1 tablet by mouth daily.  . Emtricitabine-Tenofovir DF (TRUVADA PO) Take by mouth.  . escitalopram (LEXAPRO) 10 MG tablet Take 10 mg by mouth daily.  Marland Kitchen FIBER PO Take by mouth.  . Multiple Vitamin (MULTIVITAMIN) tablet Take 1 tablet by mouth daily.  . Omega-3 Fatty Acids (FISH OIL PO) Take by mouth.  . Tadalafil (CIALIS PO) Take by mouth.   No current facility-administered medications on file prior to visit.     Review of Systems: Constitutional: No fever, sweats, or shaking chills.  No appetite or weight deficit. Skin: No rash, scaling, sores, lumps, or jaundice. HEENT: No visual changes or hearing deficit. Pulmonary: No unusual cough, sore throat, or orthopnea.  No DOE; former smoker. Cardiovascular: No coronary artery disease, angina, or myocardial infarction.  No cardiac dysrhythmia, essential hypertension; dyslipidemia. Gastrointestinal: No indigestion, dysphagia, abdominal pain, diarrhea, or constipation.   No change in bowel habits.  Infrequent bleeding hemorrhoids; no melena.  No nausea or vomiting.  No viral hepatitis. Genitourinary: No urinary frequency, urgency, hematuria, or dysuria; erectile dysfunction; HIV prophylaxis; renal calculi. Musculoskeletal: Right elbow tendinitis. No arthralgias or myalgias; no joint swelling, pain, or instability. Hematologic: No bleeding tendency or easy bruisability; elevated MCV Endocrine: No intolerance to heat or cold; no thyroid disease or diabetes mellitus. Vascular: No peripheral arterial or venous thromboembolic disease. Psychological: Mild anxiety disorder and chronic depression without suicidal ideation. Reformed alcoholic; former tobacco dependence; no mood changes. Neurological: Occasional positional dizziness. No lightheadedness, syncope, or near syncopal episodes; no numbness or tingling in the fingers or toes.  Physical Examination: Vital Signs: Body surface area is 1.92 meters squared.  Vitals:   10/21/18 1451  BP: 133/73  Pulse: 72  Resp: 18  Temp: 98.5 F (36.9 C)  SpO2: 96%    Filed Weights   10/21/18 1451  Weight: 163 lb 1.6 oz (74 kg)  Body mass index is 23.07 kg/m. ECOG PERFORMANCE STATUS: 0 Constitutional:  Ulysees Robarts is fully nourished and developed.  He looks age appropriate.  He is friendly and cooperative without respiratory compromise at rest. Skin: No rashes, scaling, dryness, jaundice, or itching. HEENT: Head is normocephalic and atraumatic.  Pupils are equal round and reactive to light and accommodation.  Sclerae are anicteric.  Conjunctivae are pink.  No sinus tenderness nor oropharyngeal lesions.  Lips without cracking or peeling; tongue without mass, inflammation, or nodularity.  Mucous membranes are moist. Neck: Supple and symmetric.  No jugular venous distention or thyromegaly.  Trachea is midline. Lymphatics: No cervical or supraclavicular lymphadenopathy.  No epitrochlear, axillary, or inguinal lymphadenopathy  is appreciated. Respiratory/chest: Thorax is symmetrical.  Breath sounds are clear to auscultation and percussion.  Normal excursion and respiratory effort. Back: Symmetric without deformity or tenderness. Cardiovascular: Heart rate and rhythm are regular without murmurs or gallops. Gastrointestinal: Abdomen is soft, nontender; no organomegaly.  Bowel sounds are normoactive.  No masses are appreciated. Extremities: In the lower extremities, there is no asymmetric swelling, erythema, tenderness, or cord formation.  No clubbing, cyanosis, nor edema. Hematologic: No petechiae, hematomas, or ecchymoses. Psychological:  He is oriented to person, place, and time; normal affect, memory, and cognition. Neurological: There are no gross neurologic deficits.  Laboratory Results: October 07, 2018  Ref Range & Units 2wk ago  WBC Count 4.0 - 10.5 K/uL 7.6   RBC 4.22 - 5.81 MIL/uL 3.87Low    Hemoglobin 13.0 - 17.0 g/dL 16.1   HCT 09.6 - 04.5 % 38.6Low    MCV 80.0 - 100.0 fL 99.7   MCH 26.0 - 34.0 pg 35.4High    MCHC 30.0 - 36.0 g/dL 40.9   RDW 81.1 - 91.4 % 12.4   Platelet Count 150 - 400 K/uL 291   nRBC 0.0 - 0.2 % 0.0   Neutrophils Relative % % 59   Neutro Abs 1.7 - 7.7 K/uL 4.4   Lymphocytes Relative % 34   Lymphs Abs 0.7 - 4.0 K/uL 2.6   Monocytes Relative % 7   Monocytes Absolute 0.1 - 1.0 K/uL 0.5   Eosinophils Relative % 0   Eosinophils Absolute 0.0 - 0.5 K/uL 0.0   Basophils Relative % 0   Basophils Absolute 0.0 - 0.1 K/uL 0.0   Immature Granulocytes % 0   Abs Immature Granulocytes 0.00 - 0.07 K/uL 0.03     Ref Range & Units 2wk ago  Retic Ct Pct 0.4 - 3.1 % 4.1High    RBC. 4.22 - 5.81 MIL/uL 3.87Low    Retic Count, Absolute 19.0 - 186.0 K/uL 158.7   Immature Retic Fract 2.3 - 15.9 % 15.5     Ref Range & Units 2wk ago  Sodium 135 - 145 mmol/L 141   Potassium 3.5 - 5.1 mmol/L 3.8   Chloride 98 - 111 mmol/L 105   CO2 22 - 32 mmol/L 26   Glucose, Bld 70 - 99 mg/dL 89   BUN 6 -  20 mg/dL 14   Creatinine, Ser 7.82 - 1.24 mg/dL 9.56   Calcium 8.9 - 21.3 mg/dL 9.1   Total Protein 6.5 - 8.1 g/dL 7.2   Albumin 3.5 - 5.0 g/dL 4.4   AST 15 - 41 U/L 26   ALT 0 - 44 U/L 21   Alkaline Phosphatase 38 - 126 U/L 85   Total Bilirubin 0.3 - 1.2 mg/dL 1.1   GFR calc non Af Amer >60 mL/min >60   GFR calc Af Amer >60 mL/min >60   Haptoglobin 40 DAT: Negative Vitamin B12 804 Folic acid 29.9  Diagnostic/Imaging Studies: September 11, 2018 CT ABDOMEN AND PELVIS WITHOUT CONTRAST  TECHNIQUE: Multidetector CT imaging of the abdomen and pelvis was performed following the standard protocol without IV contrast.  COMPARISON: None.  FINDINGS: Lower chest: No acute abnormality.  Hepatobiliary: No focal liver abnormality is  seen. No gallstones, gallbladder wall thickening, or biliary dilatation.  Pancreas: Unremarkable. No pancreatic ductal dilatation or surrounding inflammatory changes.  Spleen: Normal in size without focal abnormality.  Adrenals/Urinary Tract: Adrenal glands are within normal limits. The kidneys are well visualized bilaterally. A tiny nonobstructing stone is noted in the lower pole of the left kidney. No right renal calculi are noted. Fullness of the right renal collecting system and proximal right ureter is noted. No right ureteral stone is seen. The bladder is well distended and within normal limits.  Stomach/Bowel: Redundant colon is identified. The appendix is well visualized. A tiny appendicolith is noted within. No significant appendiceal dilatation or periappendiceal inflammatory changes are noted. Small bowel is within normal limits. Stomach is unremarkable.  Vascular/Lymphatic: No significant vascular findings are present. No enlarged abdominal or pelvic lymph nodes.  Reproductive: Prostate is unremarkable.  Other: No abdominal wall hernia or abnormality. No abdominopelvic ascites.  Musculoskeletal: Mild degenerative changes  of lumbar spine are noted. No acute abnormality is seen.  IMPRESSION: Fullness of the right renal collecting system and right ureter without definitive ureteral stone. These changes may be related to edema from recently passed stone.  Tiny 1 mm nonobstructing left renal stone.  No other focal abnormality is noted.  Alcide Clever M.D. 09/11/2018 13:45  Summary/Assessment: In the setting of an elevated MCV without anemia, he presents now for the results of his preliminary laboratory evaluation and recommendations.  Because of right-sided inguinal pain, on September 11, 2018 he underwent a CT of the abdomen and pelvis without intravenous contrast.  There was fullness identified within the right renal collecting system and right ureter without definite ureteral stone.  Those changes appear to be secondary from edema from a recently passed stone.  There was a 1 mm nonobstructing left renal calculi.  No other abnormality was noted.  Those results are detailed below.    In the process of his evaluation, a complete blood count was obtained.  The results of that complete blood count from September 27 revealed hemoglobin 15.0 hematocrit 43.5 MCV 102.1 MCH 35.3 WBC 6.2 with 64% neutrophils 29% lymphocytes 7% monocytes 1% basophil; platelets 311,000.  Dorion was previously unaware of an elevated MCV which generally corresponds to an increase in size of his red blood cells. He is a reformed alcoholic.  His last consumption of alcoholic beverages was 11 years ago.  There is no blood disorder or bleeding tendency in his immediate family.  He does not eat red meat or chicken.  He does however consume seafood.  In addition to his standard medications, he takes a multivitamin, fish oil and vitamin B complex.  He admits to infrequent bright red blood when wiping. There is no blood mixed with stool.  This happens a few times yearly.  In childhood he had a colonoscopy for "stomach problems."  He admits to  having internal hemorrhoids.  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  He has no children. He stopped smoking 2 years ago.  At the time of his initial visit, we discussed the previous laboratory studies from September 27. We discussed also the potential implications which include medication-induced, nutritional deficiency, hemolytic process, or metabolic anomaly.  He is not on any new medications in the past 3 months.  Although he tends to workout strenuously, he is not working out on a regular basis.   In the interim since his last visit, he reports no new problems or complaints. He denies  any appetite or weight deficit.  He has occasional positional dizziness. There is no unusual headache, lightheadedness, syncope, or near syncopal episode. There is no visual changes or hearing deficit.  He denies any rash or itching.  He has no unusual cough, sore throat, orthopnea.  He denies pain or difficulty in swallowing.  No fever, shaking chills, sweats, or flulike symptoms are evident.  He has no heartburn or indigestion.  No nausea, vomiting, diarrhea, or constipation are reported.  He moves his bowels every other day.  He denies melena or bright red blood per rectum.  He has very infrequent bleeding hemorrhoids. There is no urinary frequency, urgency, hematuria, or dysuria.  He denies swelling of his ankles. There is no chest or abdominal pain. He has no focal areas of bone, joint, or muscle pain. He has no epistaxis or hemoptysis.  He reports no numbness or tingling in the fingers or toes.    His other comorbid problems include hypercholesterolemia; lower back pain with sciatica; attention deficit disorder; mild anxiety disorder; infrequent bleeding hemorrhoids; erectile dysfunction; a previous sphincterotomy for anal fissure nearly 10 years ago; tendinitis of the right elbow; allergic rhinitis; renal calculi; and previous alcohol dependence.  Recommendation/Plan: We  discussed in detail the results of his preliminary laboratory evaluation.  The findings are subtle but significant.  While his hemoglobin is normal, his hematocrit and red blood cell volume are slightly decreased.  This is consistent with mild anemia.  Ironically, his MCV is now normal.  The [facility] is slightly elevated still.  While there is no convincing evidence of nutritional deficiency, methylmalonic acid and serum homocystine were recommended since these metabolites are considerably more accurate than standard vitamin B12 and folic acid levels which reflect dietary intake as opposed to tissue storage and utilization.  This nutritional deficiency is associated with an elevated MCV.  For his degree of anemia, his reticulocyte count is elevated at 4.1%.  This suggests that his bone marrow is fairly robust.  It also suggests that there may be some degree of iron deficiency or hemolysis. The direct Coombs test was negative.  Haptoglobin, however was low-normal at 40 (34-200).  His bilirubin was not elevated.  Leonard cannot recall if he exercised strenuously prior to his first visit and lab work.  Sometimes strenuous exercise can precipitate hemolysis and consequent mild anemia. In general he does not exercise on a daily basis.  It was emphasized that he is compensating very well for his degree of mild hemolysis and/or anemia.  His bilirubin is normal.  Laboratory studies today included an LDH.  Because iron deficiency without depletion can also cause an elevated reticulocyte count, iron studies were requested.  Although his diet may have some impact on his iron storage, at his age, I would not dismiss some form of bleeding and/or iron loss through his gastrointestinal tract.  The results of today's testing however are pending.  The total time spent discussing the results of his initial laboratory studies, their implications, role, and rationale for continued testing as outlined above, and recommendations was 25  minutes. At least 50% of that time was spent in face-to-face discussion, reviewing outside records, laboratory evaluation, counseling, and answering questions.  This note was dictated using voice activated technology/software.  Unfortunately, typographical errors are not uncommon, and transcription is subject to mistakes and regrettably misinterpretation.  If necessary, clarification of the above information can be discussed with me at any time.  FOLLOW UP: AS DIRECTED   cc:  Leodis Sias, MD   Toni Arthurs, MD  Hematology/Oncology [facility] [address] [place], [place] [address] Office: [phone] Main: [phone]

## 2018-10-21 NOTE — Patient Instructions (Signed)
The results of your laboratory studies were reviewed and discussed in detail.  There is evidence of mild anemia.  The MCV has decreased slightly.  Although low-normal your hemolytic indices (premature destruction) is suspicious.  Likewise with an elevated reticulocyte count, that may suggest either iron deficiency or premature destruction of cells.  Your vitamin B12 and folic acid level were normal.  Deficiencies in these vitamins can cause an elevated MCV.  For that reason, the metabolite of vitamin B12 and folic acid was requested since they are more accurate than blood levels of vitamin B12 and folic acid alone.  Barring any unforeseen complications, your next scheduled doctor visit to discuss these results is on November 21.  Please do not hesitate to call in the interim should any new or untoward problems arise.

## 2018-10-22 ENCOUNTER — Other Ambulatory Visit: Payer: BC Managed Care – PPO

## 2018-10-27 ENCOUNTER — Inpatient Hospital Stay: Payer: BC Managed Care – PPO

## 2018-10-27 DIAGNOSIS — D649 Anemia, unspecified: Secondary | ICD-10-CM | POA: Diagnosis not present

## 2018-10-27 LAB — IRON AND TIBC
IRON: 109 ug/dL (ref 42–163)
SATURATION RATIOS: 46 % (ref 20–55)
TIBC: 234 ug/dL (ref 202–409)
UIBC: 125 ug/dL (ref 117–376)

## 2018-10-27 LAB — FERRITIN: Ferritin: 133 ng/mL (ref 24–336)

## 2018-10-27 LAB — LACTATE DEHYDROGENASE: LDH: 191 U/L (ref 98–192)

## 2018-10-28 LAB — HOMOCYSTEINE: Homocysteine: 8 umol/L (ref 0.0–15.0)

## 2018-10-29 ENCOUNTER — Encounter: Payer: Self-pay | Admitting: Medical Oncology

## 2018-10-29 LAB — METHYLMALONIC ACID, SERUM: METHYLMALONIC ACID, QUANTITATIVE: 118 nmol/L (ref 0–378)

## 2018-11-05 ENCOUNTER — Encounter: Payer: Self-pay | Admitting: Hematology and Oncology

## 2018-11-05 ENCOUNTER — Other Ambulatory Visit: Payer: Self-pay | Admitting: Hematology and Oncology

## 2018-11-05 ENCOUNTER — Inpatient Hospital Stay (HOSPITAL_BASED_OUTPATIENT_CLINIC_OR_DEPARTMENT_OTHER): Payer: BC Managed Care – PPO | Admitting: Hematology and Oncology

## 2018-11-05 VITALS — BP 129/73 | HR 70 | Temp 98.6°F | Resp 18 | Ht 70.5 in | Wt 160.9 lb

## 2018-11-05 DIAGNOSIS — F1020 Alcohol dependence, uncomplicated: Secondary | ICD-10-CM

## 2018-11-05 DIAGNOSIS — J309 Allergic rhinitis, unspecified: Secondary | ICD-10-CM | POA: Diagnosis not present

## 2018-11-05 DIAGNOSIS — D649 Anemia, unspecified: Secondary | ICD-10-CM

## 2018-11-05 DIAGNOSIS — Z79899 Other long term (current) drug therapy: Secondary | ICD-10-CM

## 2018-11-05 DIAGNOSIS — N529 Male erectile dysfunction, unspecified: Secondary | ICD-10-CM | POA: Diagnosis not present

## 2018-11-05 DIAGNOSIS — R718 Other abnormality of red blood cells: Secondary | ICD-10-CM

## 2018-11-05 DIAGNOSIS — Z87891 Personal history of nicotine dependence: Secondary | ICD-10-CM

## 2018-11-05 DIAGNOSIS — Z87442 Personal history of urinary calculi: Secondary | ICD-10-CM

## 2018-11-05 DIAGNOSIS — E7800 Pure hypercholesterolemia, unspecified: Secondary | ICD-10-CM

## 2018-11-05 DIAGNOSIS — F419 Anxiety disorder, unspecified: Secondary | ICD-10-CM

## 2018-11-05 NOTE — Patient Instructions (Signed)
We discussed in detail the results of your lab work at the time of your last visit.  There is no convincing evidence of a nutritional deficiency.  Your MCV as of 4 weeks ago was within normal range.  This is a measure of the size of your red blood cells.    Barring any unforeseen complications, return visit with laboratory studies is now scheduled for January 9 with another provider.  Please do not hesitate to call should any new or untoward problems arise in the interim.   Happy happy Thanksgiving! Debbe Odeaichard Tariya Morrissette, MD Hematology/Oncology

## 2018-11-05 NOTE — Progress Notes (Signed)
Hematology/Oncology Outpatient Progress Note  Patient Name:  Anthony Kline  DOB: [birth year]   Date of Service: November 05, 2018  Referring Provider: Leodis Sias, MD [address] [place], [place] [address]   Consulting Physician: Toni Arthurs, MD Hematology/Oncology  Reason for Visit: In the setting of an elevated MCV without anemia initially, he presents now for the results of his laboratory studies and recommendations.    Brief History: Anthony Kline is a 47 year old resident of [place] whose past medical history is significant for hypercholesterolemia; lower back pain with sciatica; attention deficit disorder; mild anxiety disorder; infrequent bleeding hemorrhoids; erectile dysfunction; a previous sphincterotomy for anal fissure nearly 10 years ago; tendinitis of the right elbow; allergic rhinitis; renal calculi; and previous alcohol dependence.  His primary care physician is Dr. Leodis Sias. He is alone at this visit.  Because of right-sided inguinal pain, on September 11, 2018 he underwent a CT of the abdomen and pelvis without intravenous contrast.  There was fullness identified within the right renal collecting system and right ureter without definite ureteral stone.  Those changes appear to be secondary from edema from a recently passed stone.  There was a 1 mm nonobstructing left renal calculi.  No other abnormality was noted.  Those results are detailed below.  In the process of his evaluation, a complete blood count was obtained.  The results of that complete blood count from September 27 revealed hemoglobin 15.0 hematocrit 43.5 MCV 102.1 MCH 35.3 WBC 6.2 with 64% neutrophils 29% lymphocytes 7% monocytes 1% basophil; platelets 311,000.  Honor was previously unaware of an elevated MCV which generally corresponds to an increase in size of his red blood cells.  He is a reformed alcoholic.  His last consumption of alcoholic beverages was 11 years ago.  There is no  blood disorder or bleeding tendency in his immediate family.  He does not eat red meat or chicken.  He does however consume seafood.  In addition to his standard medications, he takes a multivitamin, fish oil and vitamin B complex.  He admits to infrequent bright red blood when wiping. There is no blood mixed with stool.  This happens a few times yearly.  In childhood he had a colonoscopy for "stomach problems."  He admits to having internal hemorrhoids.  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  He has no children. He stopped smoking 2 years ago.  At the time of his initial visit, we discussed the previous laboratory studies from September 27. We discussed also the potential implications which include medication-induced, nutritional deficiency, hemolytic process, or metabolic anomaly.  He is not on any new medications in the past 3 months.  Although he tends to workout strenuously in the past, he is not working out on a regular basis now.   There is no convincing evidence of nutritional deficiency. For his degree of anemia, his reticulocyte count is elevated at 4.1%.  Haptoglobin was low-normal at 40 (34-200).  His bilirubin was not elevated.  He did not work out prior to his lab work. Strenuous exercise can precipitate hemolysis and consequent mild anemia. In general he does not exercise on a daily basis. It is with this background he presents now for the results of his most recent laboratory studies and recommendations in the setting of an elevated MCV, now normal.  Interval History: In the interim since his last visit, he reports no new problems or complaints. He denies any appetite or weight deficit.  He has occasional positional dizziness. There is no unusual headache, lightheadedness, syncope, or near syncopal episode. There is no visual changes or hearing deficit.  He denies any rash or itching.  He has no unusual cough, sore throat, orthopnea.  He denies pain or  difficulty in swallowing.  No fever, shaking chills, sweats, or flulike symptoms are evident.  He has no heartburn or indigestion.  No nausea, vomiting, diarrhea, or constipation are reported.  He moves his bowels every other day.  He denies melena or bright red blood per rectum.  He has very infrequent bleeding hemorrhoids. There is no urinary frequency, urgency, hematuria, or dysuria.  He denies swelling of his ankles. There is no chest or abdominal pain. He has no focal areas of bone, joint, or muscle pain. He has no epistaxis or hemoptysis.  He reports no numbness or tingling in the fingers or toes.    Past Medical History Reviewed        Family History Reviewed       Social History Reviewed  Past Medical History:  Diagnosis Date  . Anxiety   Hypercholesterolemia Antiretroviral prophylaxis (HIV) Renal calculi Chronic depression without suicidal ideation Anxiety disorder Erectile dysfunction Tendinitis of the right elbow Internal hemorrhoids Recovered alcoholic  Surgical History: Bilateral sphincterotomy for anal fissure 10 years ago.  Allergies  Allergen Reactions  . Augmentin [Amoxicillin-Pot Clavulanate]    Current Outpatient Medications on File Prior to Visit  Medication Sig  . Amphetamine-Dextroamphetamine (ADDERALL PO) Take 20 mg by mouth. Patient reports taking 1/4 of 20mg  tablet.  Marland Kitchen b complex vitamins tablet Take 1 tablet by mouth daily.  . Emtricitabine-Tenofovir DF (TRUVADA PO) Take by mouth.  . escitalopram (LEXAPRO) 10 MG tablet Take 20 mg by mouth daily.   Marland Kitchen FIBER PO Take by mouth.  . Multiple Vitamin (MULTIVITAMIN) tablet Take 1 tablet by mouth daily.  . Omega-3 Fatty Acids (FISH OIL PO) Take by mouth.  . Tadalafil (CIALIS PO) Take by mouth.   No current facility-administered medications on file prior to visit.     Review of Systems: Constitutional: No fever, sweats, or shaking chills.  No appetite or weight deficit. Skin: No rash, scaling, sores, lumps, or  jaundice. HEENT: No visual changes or hearing deficit. Pulmonary: No unusual cough, sore throat, or orthopnea.  No DOE; former smoker. Cardiovascular: No coronary artery disease, angina, or myocardial infarction.  No cardiac dysrhythmia, essential hypertension; dyslipidemia. Gastrointestinal: No indigestion, dysphagia, abdominal pain, diarrhea, or constipation.  No change in bowel habits.  Infrequent bleeding hemorrhoids; no melena.  No nausea or vomiting.  No viral hepatitis. Genitourinary: No urinary frequency, urgency, hematuria, or dysuria; erectile dysfunction; HIV prophylaxis; renal calculi. Musculoskeletal: Right elbow tendinitis. No arthralgias or myalgias; no joint swelling, pain, or instability. Hematologic: No bleeding tendency or easy bruisability; elevated MCV Endocrine: No intolerance to heat or cold; no thyroid disease or diabetes mellitus. Vascular: No peripheral arterial or venous thromboembolic disease. Psychological: Mild anxiety disorder and chronic depression without suicidal ideation. Reformed alcoholic; former tobacco dependence; no mood changes. Neurological: Occasional positional dizziness. No lightheadedness, syncope, or near syncopal episodes; no numbness or tingling in the fingers or toes.  Physical Examination: Vital Signs: Body surface area is 1.91 meters squared.  Vitals:   11/05/18 1601  BP: 129/73  Pulse: 70  Resp: 18  Temp: 98.6 F (37 C)  SpO2: 97%    Filed Weights   11/05/18 1601  Weight: 160 lb 13.9 oz (73 kg)  Body mass index is  22.76 kg/m. ECOG PERFORMANCE STATUS: 0 Constitutional:  Anthony Kline is fully nourished and developed.  He looks age appropriate.  He is friendly and cooperative without respiratory compromise at rest. Skin: No rashes, scaling, dryness, jaundice, or itching. HEENT: Head is normocephalic and atraumatic.  Pupils are equal round and reactive to light and accommodation.  Sclerae are anicteric.  Conjunctivae are pink.  No  sinus tenderness nor oropharyngeal lesions.  Lips without cracking or peeling; tongue without mass, inflammation, or nodularity.  Mucous membranes are moist. Neck: Supple and symmetric.  No jugular venous distention or thyromegaly.  Trachea is midline. Lymphatics: No cervical or supraclavicular lymphadenopathy.  No epitrochlear, axillary, or inguinal lymphadenopathy is appreciated. Respiratory/chest: Thorax is symmetrical.  Breath sounds are clear to auscultation and percussion.  Normal excursion and respiratory effort. Back: Symmetric without deformity or tenderness. Cardiovascular: Heart rate and rhythm are regular without murmurs or gallops. Gastrointestinal: Abdomen is soft, nontender; no organomegaly.  Bowel sounds are normoactive.  No masses are appreciated. Extremities: In the lower extremities, there is no asymmetric swelling, erythema, tenderness, or cord formation.  No clubbing, cyanosis, nor edema. Hematologic: No petechiae, hematomas, or ecchymoses. Psychological:  He is oriented to person, place, and time; normal affect, memory, and cognition. Neurological: There are no gross neurologic deficits.  Laboratory Results: October 27, 2018 LDH 191 Serum ferritin 133 Iron/TIBC 109/234 Iron saturation 46% Methylmalonic acid 118 Homocysteine 8.0  October 07, 2018  Ref Range & Units 2wk ago  WBC Count 4.0 - 10.5 K/uL 7.6   RBC 4.22 - 5.81 MIL/uL 3.87Low    Hemoglobin 13.0 - 17.0 g/dL 16.1   HCT 09.6 - 04.5 % 38.6Low    MCV 80.0 - 100.0 fL 99.7   MCH 26.0 - 34.0 pg 35.4High    MCHC 30.0 - 36.0 g/dL 40.9   RDW 81.1 - 91.4 % 12.4   Platelet Count 150 - 400 K/uL 291   nRBC 0.0 - 0.2 % 0.0   Neutrophils Relative % % 59   Neutro Abs 1.7 - 7.7 K/uL 4.4   Lymphocytes Relative % 34   Lymphs Abs 0.7 - 4.0 K/uL 2.6   Monocytes Relative % 7   Monocytes Absolute 0.1 - 1.0 K/uL 0.5   Eosinophils Relative % 0   Eosinophils Absolute 0.0 - 0.5 K/uL 0.0   Basophils Relative % 0     Basophils Absolute 0.0 - 0.1 K/uL 0.0   Immature Granulocytes % 0   Abs Immature Granulocytes 0.00 - 0.07 K/uL 0.03     Ref Range & Units 2wk ago  Retic Ct Pct 0.4 - 3.1 % 4.1High    RBC. 4.22 - 5.81 MIL/uL 3.87Low    Retic Count, Absolute 19.0 - 186.0 K/uL 158.7   Immature Retic Fract 2.3 - 15.9 % 15.5     Ref Range & Units 2wk ago  Sodium 135 - 145 mmol/L 141   Potassium 3.5 - 5.1 mmol/L 3.8   Chloride 98 - 111 mmol/L 105   CO2 22 - 32 mmol/L 26   Glucose, Bld 70 - 99 mg/dL 89   BUN 6 - 20 mg/dL 14   Creatinine, Ser 7.82 - 1.24 mg/dL 9.56   Calcium 8.9 - 21.3 mg/dL 9.1   Total Protein 6.5 - 8.1 g/dL 7.2   Albumin 3.5 - 5.0 g/dL 4.4   AST 15 - 41 U/L 26   ALT 0 - 44 U/L 21   Alkaline Phosphatase 38 - 126 U/L 85   Total  Bilirubin 0.3 - 1.2 mg/dL 1.1   GFR calc non Af Amer >60 mL/min >60   GFR calc Af Amer >60 mL/min >60   Haptoglobin 40 DAT: Negative Vitamin B12 804 Folic acid 29.9  Diagnostic/Imaging Studies: September 11, 2018 CT ABDOMEN AND PELVIS WITHOUT CONTRAST  TECHNIQUE: Multidetector CT imaging of the abdomen and pelvis was performed following the standard protocol without IV contrast.  COMPARISON: None.  FINDINGS: Lower chest: No acute abnormality.  Hepatobiliary: No focal liver abnormality is seen. No gallstones, gallbladder wall thickening, or biliary dilatation.  Pancreas: Unremarkable. No pancreatic ductal dilatation or surrounding inflammatory changes.  Spleen: Normal in size without focal abnormality.  Adrenals/Urinary Tract: Adrenal glands are within normal limits. The kidneys are well visualized bilaterally. A tiny nonobstructing stone is noted in the lower pole of the left kidney. No right renal calculi are noted. Fullness of the right renal collecting system and proximal right ureter is noted. No right ureteral stone is seen. The bladder is well distended and within normal limits.  Stomach/Bowel: Redundant colon is identified.  The appendix is well visualized. A tiny appendicolith is noted within. No significant appendiceal dilatation or periappendiceal inflammatory changes are noted. Small bowel is within normal limits. Stomach is unremarkable.  Vascular/Lymphatic: No significant vascular findings are present. No enlarged abdominal or pelvic lymph nodes.  Reproductive: Prostate is unremarkable.  Other: No abdominal wall hernia or abnormality. No abdominopelvic ascites.  Musculoskeletal: Mild degenerative changes of lumbar spine are noted. No acute abnormality is seen.  IMPRESSION: Fullness of the right renal collecting system and right ureter without definitive ureteral stone. These changes may be related to edema from recently passed stone.  Tiny 1 mm nonobstructing left renal stone.  No other focal abnormality is noted.  Alcide Clever M.D. 09/11/2018 13:45  Summary/Assessment: In the setting of an elevated MCV without anemia initially, he presents now for the results of his laboratory studies and recommendations.    Brief History: Anthony Kline is a 47 year old resident of [place] whose past medical history is significant for hypercholesterolemia; lower back pain with sciatica; attention deficit disorder; mild anxiety disorder; infrequent bleeding hemorrhoids; erectile dysfunction; a previous sphincterotomy for anal fissure nearly 10 years ago; tendinitis of the right elbow; allergic rhinitis; renal calculi; and previous alcohol dependence.  His primary care physician is Dr. Leodis Sias. He is alone at this visit.  Because of right-sided inguinal pain, on September 11, 2018 he underwent a CT of the abdomen and pelvis without intravenous contrast.  There was fullness identified within the right renal collecting system and right ureter without definite ureteral stone.  Those changes appear to be secondary from edema from a recently passed stone.  There was a 1 mm nonobstructing left renal  calculi.  No other abnormality was noted. Those results are detailed below.  In the process of his evaluation, a complete blood count was obtained.  The results of that complete blood count from September 27 revealed hemoglobin 15.0 hematocrit 43.5 MCV 102.1 MCH 35.3 WBC 6.2 with 64% neutrophils 29% lymphocytes 7% monocytes 1% basophil; platelets 311,000.  Anthony Kline was previously unaware of an elevated MCV which generally corresponds to an increase in size of his red blood cells.  He is a reformed alcoholic.  His last consumption of alcoholic beverages was 11 years ago.  There is no blood disorder or bleeding tendency in his immediate family.  He does not eat red meat or chicken.  He does however consume seafood.  In addition to his  standard medications, he takes a multivitamin, fish oil and vitamin B complex.  He admits to infrequent bright red blood when wiping. There is no blood mixed with stool.  This happens a few times yearly.  In childhood he had a colonoscopy for "stomach problems."  He admits to having internal hemorrhoids.  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  He has no children. He stopped smoking 2 years ago.  At the time of his initial visit, we discussed the previous laboratory studies from September 27. We discussed also the potential implications which include medication-induced, nutritional deficiency, hemolytic process, or metabolic anomaly.  He is not on any new medications in the past 3 months.  Although he tends to workout strenuously in the past, he is not working out on a regular basis now.   There is no convincing evidence of nutritional deficiency. For his degree of anemia, his reticulocyte count is elevated at 4.1%.  Haptoglobin was low-normal at 40 (34-200).  His bilirubin was not elevated.  He did not work out prior to his lab work. Strenuous exercise can precipitate hemolysis and consequent mild anemia. In general he does not exercise on a  daily basis  In the interim since his last visit, he reports no new problems or complaints. He denies any appetite or weight deficit.  He has occasional positional dizziness. There is no unusual headache, lightheadedness, syncope, or near syncopal episode. There is no visual changes or hearing deficit.  He denies any rash or itching.  He has no unusual cough, sore throat, orthopnea.  He denies pain or difficulty in swallowing.  No fever, shaking chills, sweats, or flulike symptoms are evident.  He has no heartburn or indigestion.  No nausea, vomiting, diarrhea, or constipation are reported.  He moves his bowels every other day.  He denies melena or bright red blood per rectum.  He has very infrequent bleeding hemorrhoids. There is no urinary frequency, urgency, hematuria, or dysuria.  He denies swelling of his ankles. There is no chest or abdominal pain. He has no focal areas of bone, joint, or muscle pain. He has no epistaxis or hemoptysis. He reports no numbness or tingling in the fingers or toes.    His other comorbid problems include hypercholesterolemia; lower back pain with sciatica; attention deficit disorder; mild anxiety disorder; infrequent bleeding hemorrhoids; erectile dysfunction; a previous sphincterotomy for anal fissure nearly 10 years ago; tendinitis of the right elbow; allergic rhinitis; renal calculi; and previous alcohol dependence.  Recommendation/Plan: We discussed in detail the results of his laboratory studies. The findings are subtle but significant. While his hemoglobin is normal, his hematocrit and red blood cell volume are slightly decreased.  This is consistent with mild anemia.  Ironically, his MCV is now normal.  [facility].  There is no convincing evidence of nutritional deficiency.  For his degree of mild anemia, his reticulocyte count is elevated at 4.1%. This suggests that his bone marrow is fairly robust.  It also suggests that there may some  degree of hemolysis. The direct Coombs test was negative.  Haptoglobin, however was low-normal at 40 (34-200).  His bilirubin was not elevated.  He did not work out prior to his lab work.  Of course, strenuous exercise can precipitate hemolysis and consequent mild anemia. In general he does not exercise on a daily basis.  It was emphasized that he is compensating well for his degree of very mild hemolysis and/or  anemia.  His bilirubin is normal.  His LDH is normal. There is no convincing evidence of iron deficiency.  Poor iron utilization is more likely given his iron studies detailed above.  For the present, I would not do anything more aggressive or invasive.  It was recommended that a follow-up visit with a new provider, since I am leaving the practice, to insure stability was recommended on December 24, 2018.  At that time laboratory studies will be repeated to include complete blood count and haptoglobin.  This note was dictated using voice activated technology/software.  Unfortunately, typographical errors are not uncommon, and transcription is subject to mistakes and regrettably misinterpretation.  If necessary, clarification of the above information can be discussed with me at any time.  FOLLOW UP: AS DIRECTED   cc:     Leodis Sias, MD   Toni Arthurs, MD  Hematology/Oncology [facility] [address] [place], [place] [address] Office: [phone] Main: [phone]

## 2018-11-06 ENCOUNTER — Telehealth: Payer: Self-pay | Admitting: Hematology and Oncology

## 2018-11-06 NOTE — Telephone Encounter (Signed)
Called patient to confirm appointments for  Jan 2020. No answer.  Printed and mailed calendar.

## 2018-12-17 ENCOUNTER — Telehealth: Payer: Self-pay | Admitting: Internal Medicine

## 2018-12-17 NOTE — Telephone Encounter (Signed)
RR not in office. Moved appointments for 1/9 to 1/24 with [facility]. Left message for patient. Schedule mailed.

## 2018-12-24 ENCOUNTER — Other Ambulatory Visit: Payer: BC Managed Care – PPO

## 2018-12-24 ENCOUNTER — Ambulatory Visit: Payer: BC Managed Care – PPO | Admitting: Hematology and Oncology

## 2019-01-07 ENCOUNTER — Telehealth: Payer: Self-pay | Admitting: Internal Medicine

## 2019-01-07 NOTE — Telephone Encounter (Signed)
Patient called to cancel 1/24 lab/fu. Per patient he does not wish to reschedule at this time.

## 2019-01-08 ENCOUNTER — Other Ambulatory Visit: Payer: BC Managed Care – PPO

## 2019-01-08 ENCOUNTER — Ambulatory Visit: Payer: BC Managed Care – PPO | Admitting: Internal Medicine

## 2020-08-01 IMAGING — CT CT ABD-PELV W/O CM
1 of 2 series · 14 of 32 positions shown, 19 images · non-contrast
Comparison: None.

CLINICAL DATA: Right-sided pain for several days with hematuria

EXAM:
CT ABDOMEN AND PELVIS WITHOUT CONTRAST
TECHNIQUE: Multidetector CT imaging of the abdomen and pelvis was performed
following the standard protocol without IV contrast.

[Series 2: renal standard/full · axial · 0.78mm/px · z∈[-466,-56]mm · 14 of 94 slices shown, 19 images]
[im 6/94  soft-tissue]
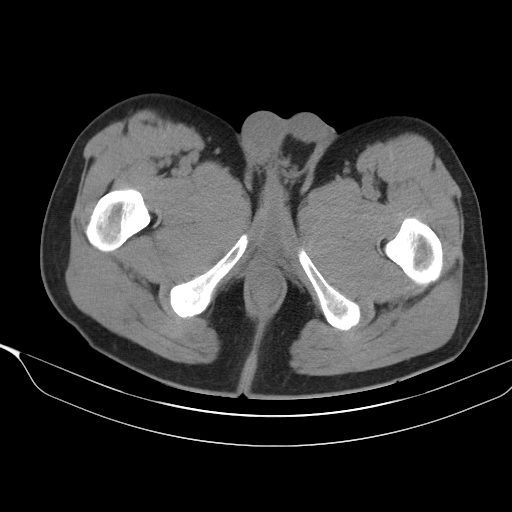
[im 6/94  bone]
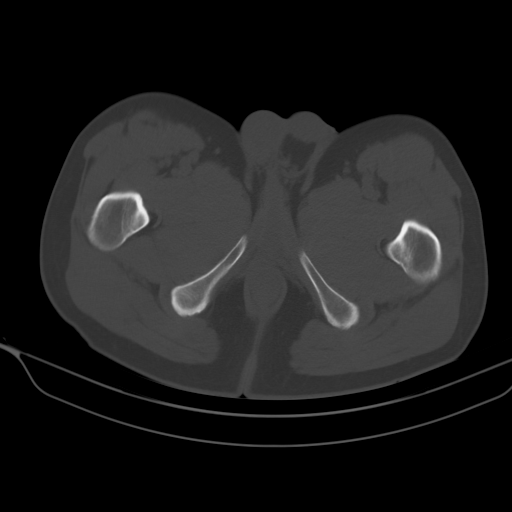
[im 11/94  soft-tissue]
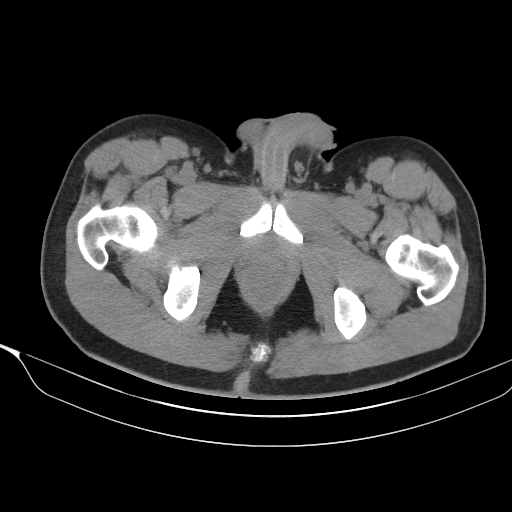
[im 22/94  soft-tissue]
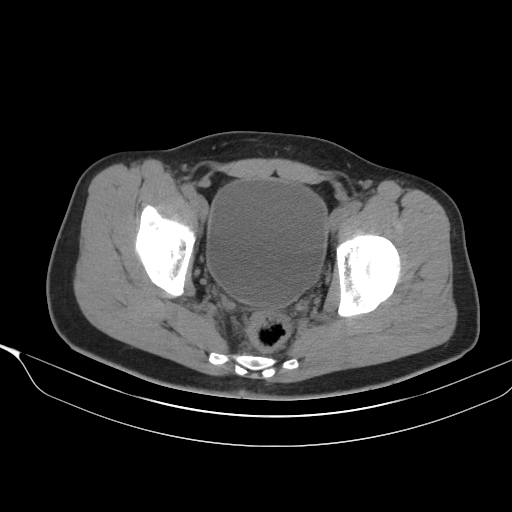
[im 28/94  soft-tissue]
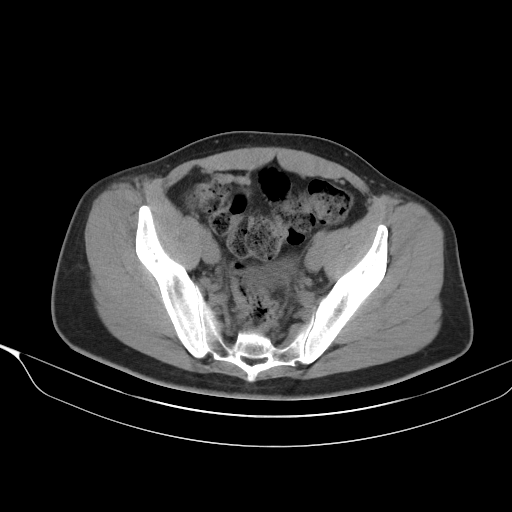
[im 33/94  soft-tissue]
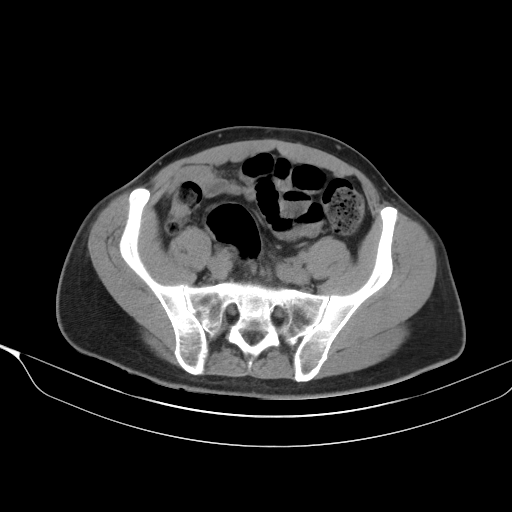
[im 39/94  soft-tissue]
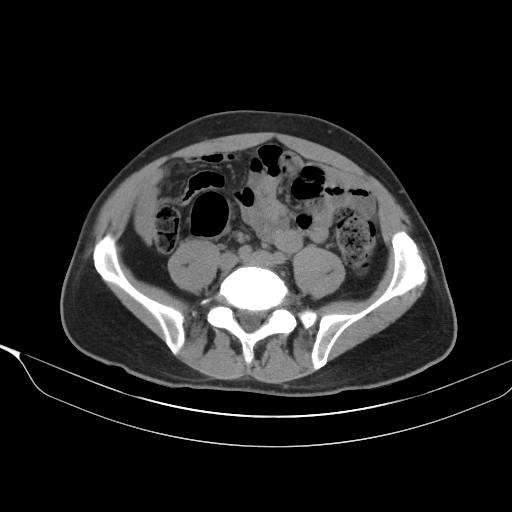
[im 50/94  soft-tissue]
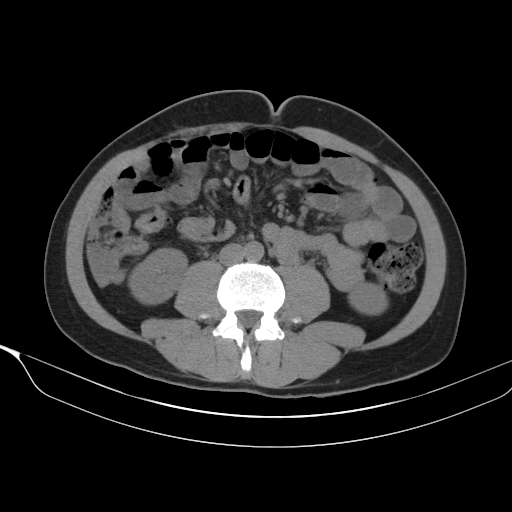
[im 55/94  soft-tissue]
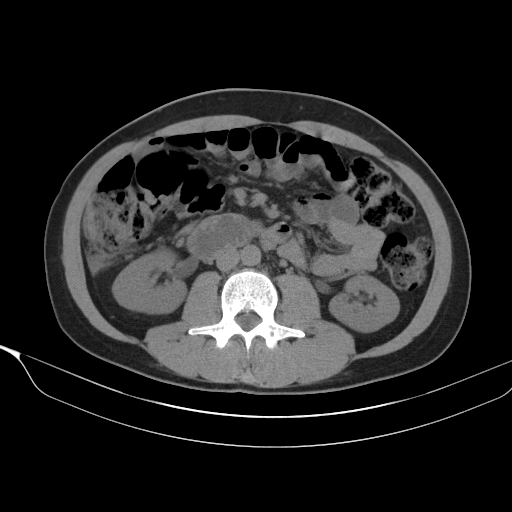
[im 61/94  soft-tissue]
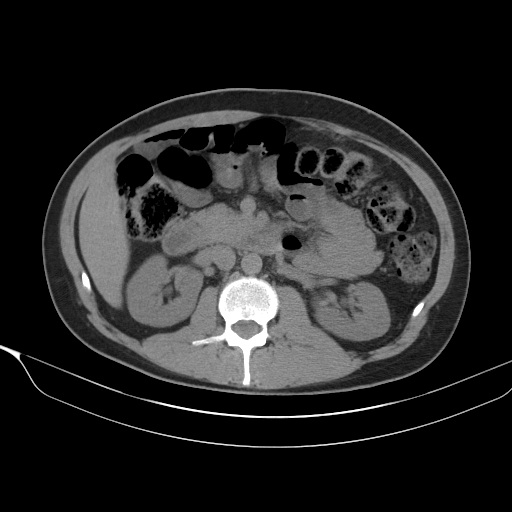
[im 61/94  bone]
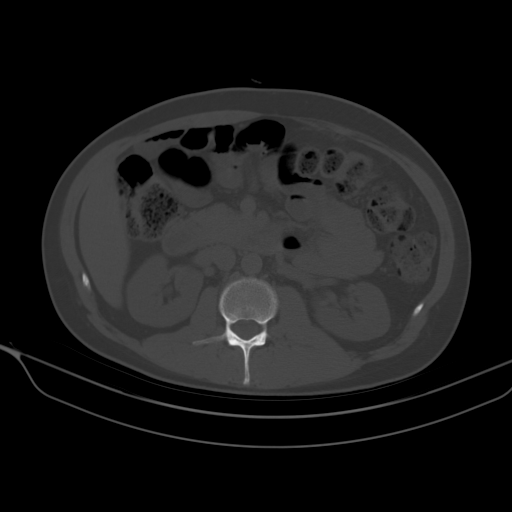
[im 66/94  soft-tissue]
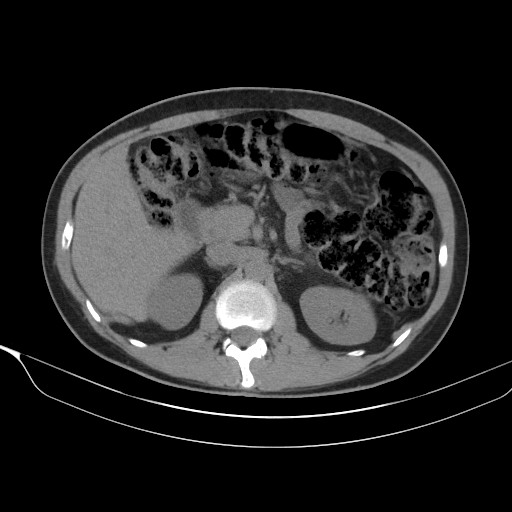
[im 72/94  soft-tissue]
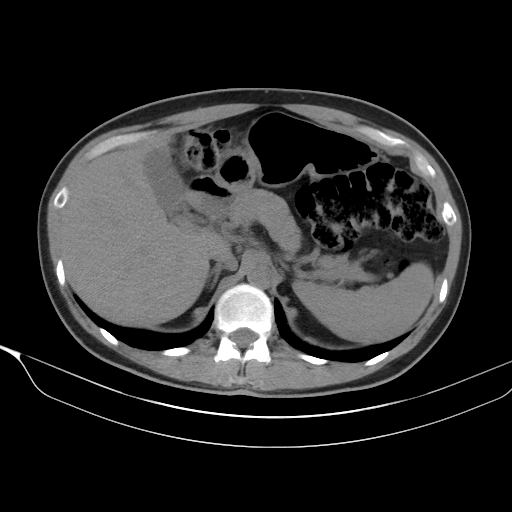
[im 72/94  lung]
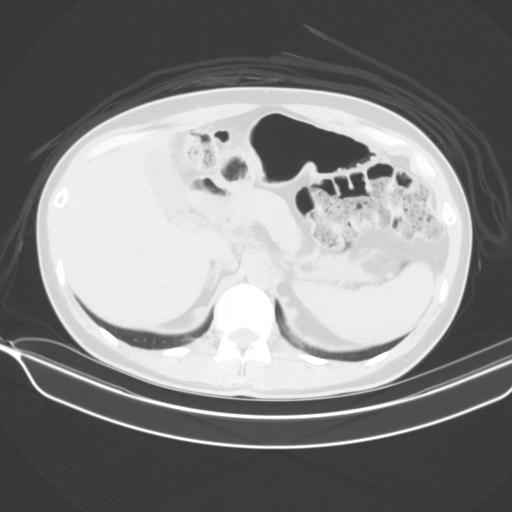
[im 77/94  lung]
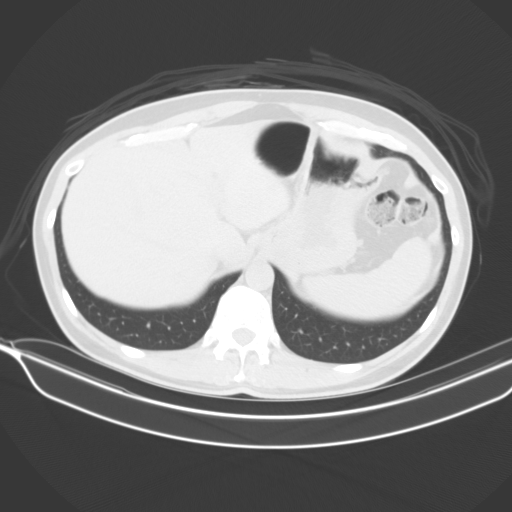
[im 83/94  soft-tissue]
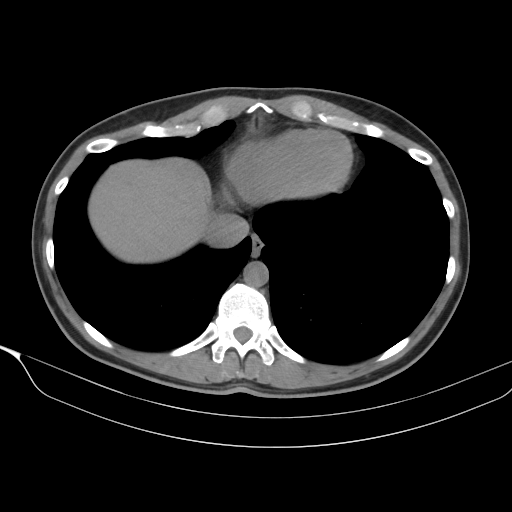
[im 83/94  lung]
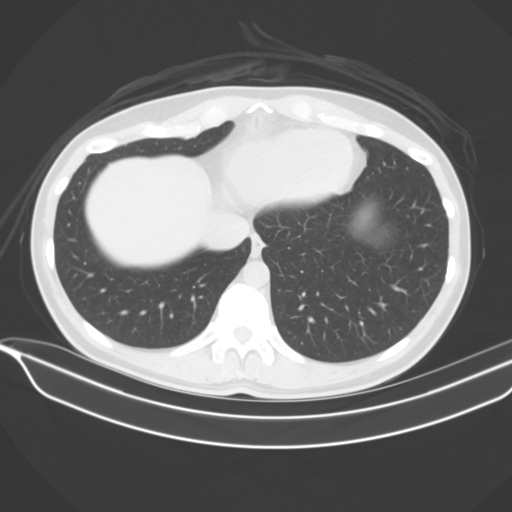
[im 88/94  soft-tissue]
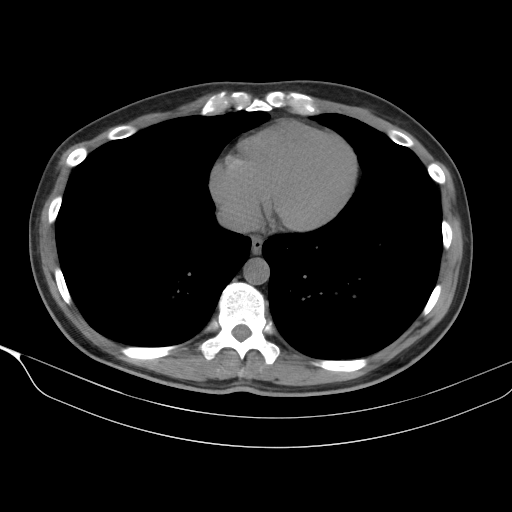
[im 88/94  lung]
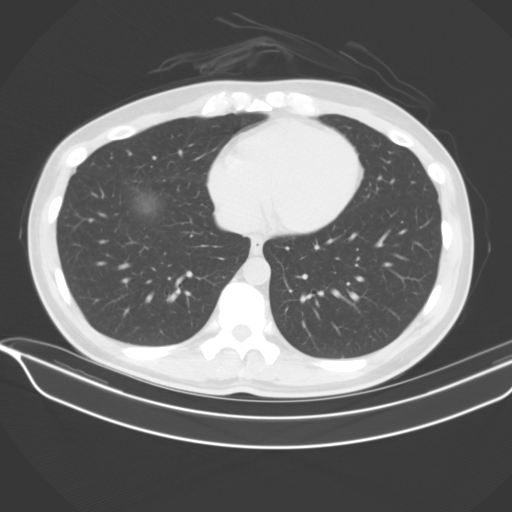

[14 of 32 positions shown; findings below may reference images not displayed]

FINDINGS: Lower chest: No acute abnormality.

Hepatobiliary: No focal liver abnormality is seen. No gallstones,
gallbladder wall thickening, or biliary dilatation.

Pancreas: Unremarkable. No pancreatic ductal dilatation or
surrounding inflammatory changes.

Spleen: Normal in size without focal abnormality.

Adrenals/Urinary Tract: Adrenal glands are within normal limits. The
kidneys are well visualized bilaterally. A tiny nonobstructing stone
is noted in the lower pole of the left kidney. No right renal
calculi are noted. Fullness of the right renal collecting system and
proximal right ureter is noted. No right ureteral stone is seen. The
bladder is well distended and within normal limits.

Stomach/Bowel: Redundant colon is identified. The appendix is well
visualized. A tiny appendicolith is noted within. No significant
appendiceal dilatation or periappendiceal inflammatory changes are
noted. Small bowel is within normal limits. Stomach is unremarkable.

Vascular/Lymphatic: No significant vascular findings are present. No
enlarged abdominal or pelvic lymph nodes.

Reproductive: Prostate is unremarkable.

Other: No abdominal wall hernia or abnormality. No abdominopelvic
ascites.

Musculoskeletal: Mild degenerative changes of lumbar spine are
noted. No acute abnormality is seen.
IMPRESSION: Fullness of the right renal collecting system and right ureter
without definitive ureteral stone. These changes may be related to
edema from recently passed stone.

Tiny 1 mm nonobstructing left renal stone.

No other focal abnormality is noted.

## 2024-07-15 ENCOUNTER — Encounter: Payer: Self-pay | Admitting: Gastroenterology

## 2024-09-08 ENCOUNTER — Ambulatory Visit: Payer: Self-pay | Admitting: Gastroenterology

## 2024-09-08 ENCOUNTER — Encounter: Payer: Self-pay | Admitting: Gastroenterology

## 2024-09-08 ENCOUNTER — Other Ambulatory Visit (INDEPENDENT_AMBULATORY_CARE_PROVIDER_SITE_OTHER)

## 2024-09-08 VITALS — BP 110/74 | HR 76 | Ht 69.0 in | Wt 174.0 lb

## 2024-09-08 DIAGNOSIS — K625 Hemorrhage of anus and rectum: Secondary | ICD-10-CM

## 2024-09-08 DIAGNOSIS — Z83719 Family history of colon polyps, unspecified: Secondary | ICD-10-CM

## 2024-09-08 DIAGNOSIS — Z8719 Personal history of other diseases of the digestive system: Secondary | ICD-10-CM

## 2024-09-08 DIAGNOSIS — K6289 Other specified diseases of anus and rectum: Secondary | ICD-10-CM

## 2024-09-08 DIAGNOSIS — K649 Unspecified hemorrhoids: Secondary | ICD-10-CM

## 2024-09-08 LAB — CBC WITH DIFFERENTIAL/PLATELET
Basophils Absolute: 0 K/uL (ref 0.0–0.1)
Basophils Relative: 0.3 % (ref 0.0–3.0)
Eosinophils Absolute: 0 K/uL (ref 0.0–0.7)
Eosinophils Relative: 0.3 % (ref 0.0–5.0)
HCT: 46.2 % (ref 39.0–52.0)
Hemoglobin: 15.6 g/dL (ref 13.0–17.0)
Lymphocytes Relative: 43.4 % (ref 12.0–46.0)
Lymphs Abs: 3.1 K/uL (ref 0.7–4.0)
MCHC: 33.8 g/dL (ref 30.0–36.0)
MCV: 107.6 fl — ABNORMAL HIGH (ref 78.0–100.0)
Monocytes Absolute: 0.5 K/uL (ref 0.1–1.0)
Monocytes Relative: 7.2 % (ref 3.0–12.0)
Neutro Abs: 3.5 K/uL (ref 1.4–7.7)
Neutrophils Relative %: 48.8 % (ref 43.0–77.0)
Platelets: 302 K/uL (ref 150.0–400.0)
RBC: 4.29 Mil/uL (ref 4.22–5.81)
RDW: 12.6 % (ref 11.5–15.5)
WBC: 7.3 K/uL (ref 4.0–10.5)

## 2024-09-08 LAB — COMPREHENSIVE METABOLIC PANEL WITH GFR
ALT: 19 U/L (ref 0–53)
AST: 21 U/L (ref 0–37)
Albumin: 5 g/dL (ref 3.5–5.2)
Alkaline Phosphatase: 87 U/L (ref 39–117)
BUN: 15 mg/dL (ref 6–23)
CO2: 30 meq/L (ref 19–32)
Calcium: 10.1 mg/dL (ref 8.4–10.5)
Chloride: 104 meq/L (ref 96–112)
Creatinine, Ser: 0.82 mg/dL (ref 0.40–1.50)
GFR: 101.54 mL/min (ref >60.00)
Glucose, Bld: 100 mg/dL — ABNORMAL HIGH (ref 70–99)
Potassium: 4.8 meq/L (ref 3.5–5.1)
Sodium: 139 meq/L (ref 135–145)
Total Bilirubin: 0.9 mg/dL (ref 0.2–1.2)
Total Protein: 7.8 g/dL (ref 6.0–8.3)

## 2024-09-08 LAB — IBC + FERRITIN
Ferritin: 127.4 ng/mL (ref 22.0–322.0)
Iron: 126 ug/dL (ref 42–165)
Saturation Ratios: 41.9 % (ref 20.0–50.0)
TIBC: 301 ug/dL (ref 250.0–450.0)
Transferrin: 215 mg/dL (ref 212.0–360.0)

## 2024-09-08 MED ORDER — NA SULFATE-K SULFATE-MG SULF 17.5-3.13-1.6 GM/177ML PO SOLN: 1.0000 | Freq: Once | ORAL | 0 refills | Status: AC

## 2024-09-08 NOTE — Progress Notes (Unsigned)
 NYSHAUN STANDAGE [id number] 05-Dec-1971   Chief Complaint:  Referring Provider: Delayne Artist PARAS, MD Primary GI MD: Sampson  HPI: Anthony Kline is a 53 y.o. 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    Referred by PCP at [facility] at [facility] for complaint of intermittent rectal pain and bleeding.  Noted to have had a colonoscopy January 2023 at [facility] (Dr. Burnette) with no mention of hemorrhoids.   Intermittent rectal pain and bleeding History of anal intercourse/anal receptive Using prep H and 1% dibucaine helps when he starts detecting hemorrhoids, resolves faster and doesn't get much worse Increases fiber, eats cleaner Has history of chronic constipation growing up, fissures, hemorrhoids  Had surgery - bilateral anal sphincterotomy 10-15 years ago, done here in [place], helped a lot with symptoms, had a fissure that wouldn't resolve No further problems with fissures, just hemorrhoids Has rough spot inside rectum When he has bleeding unsure if it is coming from this area Asked about it right before Covid Has been to a few GI providers Was unable to have colon due to Covid Had colonoscopy but never had follow up  When he touches this area it is sore/tender to varying degrees Feels like a rough patch of skin Up to first knuckle  Main concern is rectal pain  Fam history of colon polyps? Paternal grandfather had polyps, died of cancer and did have colon cancer but unsure if this was primary Colon cancer? No other  Diverticulosis Lifelong digestive issues  Regular bowel movements Takes a prebiotic fiber supplement, 8 capsules daily and this helps a lot Drinks kombucha Not needing stool softeners or laxatives Occ has softer stools Tries to eat clean, pescatarian Not  straining  Bleeding comes and goes Within a 3 month span will have a flare up to varying degrees Last time he had issues was over the summer Sees blood in the toilet, on the TP, in the stool More than has experienced in the past  Douching prior to sex causes some straining, doesn't think this is the problem, but could possibly   Gets STD screening every 3 months Has had blood work in the last year Does tend to have low iron, was going over to the cancer center to get screened regularly Not taking iron supplement   Has had trouble with acid reflux in the last 6 months, was buying TUMs for the first time, was daily for a month Not having issues now No abd pain   No heart or lung problems No SOB or chest pain    Previous GI Procedures/Imaging   CT A/P 09/11/2018 Fullness of the right renal collecting system and right ureter without definitive ureteral stone. These changes may be related to edema from recently passed stone.   Tiny 1 mm nonobstructing left renal stone.   No other focal abnormality is noted.  Past Medical History:  Diagnosis Date   Anal fissure    Anxiety     Past Surgical History:  Procedure Laterality Date   CYST REMOVAL NECK     SPHINCTEROTOMY Bilateral     Current Outpatient Medications  Medication Sig Dispense Refill   Emtricitabine-Tenofovir DF (TRUVADA PO) Take by mouth.     escitalopram (LEXAPRO) 10 MG tablet Take 20 mg by mouth daily.      FIBER PO Take  by mouth.     latanoprost (XALATAN) 0.005 % ophthalmic solution Place 1 drop into both eyes at bedtime.     Latanoprostene Bunod (VYZULTA) 0.024 % SOLN Place 1 drop into both eyes at bedtime.     Multiple Vitamin (MULTIVITAMIN) tablet Take 1 tablet by mouth daily.     Omega-3 Fatty Acids (FISH OIL PO) Take by mouth.     Tadalafil (CIALIS PO) Take by mouth. (Patient taking differently: Take 0.5 tablets by mouth as needed.)     valACYclovir (VALTREX) 500 MG tablet Take 500 mg by mouth daily.      No current facility-administered medications for this visit.    Allergies as of 09/08/2024 - Review Complete 11/05/2018  Allergen Reaction Noted   Augmentin [amoxicillin-pot clavulanate]  02/27/2014    Family History  Problem Relation Age of Onset   Breast cancer Mother    Breast cancer Sister    Lung cancer Paternal Grandmother    Colon polyps Paternal Grandfather    Cancer Paternal Grandfather        unsure of the type    Social History   Tobacco Use   Smoking status: Former   Smokeless tobacco: Former  Substance Use Topics   Alcohol use: Not Currently   Drug use: No     Review of Systems:    Constitutional: No weight loss, fever, chills, weakness or fatigue Eyes: No change in vision Ears, Nose, Throat:  No change in hearing or congestion Skin: No rash or itching Cardiovascular: No chest pain, chest pressure or palpitations   Respiratory: No SOB or cough Gastrointestinal: See HPI and otherwise negative Genitourinary: No dysuria or change in urinary frequency Neurological: No headache, dizziness or syncope Musculoskeletal: No new muscle or joint pain Hematologic: No bleeding or bruising    Physical Exam:  Vital signs: BP 110/74   Pulse 76   Ht 5' 9 (1.753 m)   Wt 174 lb (78.9 kg)   SpO2 94%   BMI 25.70 kg/m   Constitutional: NAD, Well developed, Well nourished, alert and cooperative Head:  Normocephalic and atraumatic.  Eyes: No scleral icterus. Conjunctiva pink. Mouth: No oral lesions. Respiratory: Respirations even and unlabored. Lungs clear to auscultation bilaterally.  No wheezes, crackles, or rhonchi.  Cardiovascular:  Regular rate and rhythm. No murmurs. No peripheral edema. Gastrointestinal:  Soft, nondistended, nontender. No rebound or guarding. Normal bowel sounds. No appreciable masses or hepatomegaly. Rectal:  Not performed.  Neurologic:  Alert and oriented x4;  grossly normal neurologically.  Skin:   Dry and intact without significant  lesions or rashes. Psychiatric: Oriented to person, place and time. Demonstrates good judgement and reason without abnormal affect or behaviors.   RELEVANT LABS AND IMAGING: CBC    Component Value Date/Time   WBC 7.6 10/07/2018 1609   RBC 3.87 (L) 10/07/2018 1609   RBC 3.87 (L) 10/07/2018 1609   HGB 13.7 10/07/2018 1609   HCT 38.6 (L) 10/07/2018 1609   PLT 291 10/07/2018 1609   MCV 99.7 10/07/2018 1609   MCH 35.4 (H) 10/07/2018 1609   MCHC 35.5 10/07/2018 1609   RDW 12.4 10/07/2018 1609   LYMPHSABS 2.6 10/07/2018 1609   MONOABS 0.5 10/07/2018 1609   EOSABS 0.0 10/07/2018 1609   BASOSABS 0.0 10/07/2018 1609    CMP     Component Value Date/Time   NA 141 10/07/2018 1609   K 3.8 10/07/2018 1609   CL 105 10/07/2018 1609   CO2 26 10/07/2018 1609   GLUCOSE  89 10/07/2018 1609   BUN 14 10/07/2018 1609   CREATININE 0.86 10/07/2018 1609   CALCIUM 9.1 10/07/2018 1609   PROT 7.2 10/07/2018 1609   ALBUMIN 4.4 10/07/2018 1609   AST 26 10/07/2018 1609   ALT 21 10/07/2018 1609   ALKPHOS 85 10/07/2018 1609   BILITOT 1.1 10/07/2018 1609   GFRNONAA >60 10/07/2018 1609   GFRAA >60 10/07/2018 1609     Assessment/Plan:    Request past colonoscopy records If this is first time he has been having any rectal bleeding we will go ahead and repeat colonoscopy, particularly if no mention of hemorrhoids on the procedure Check CBC, CMP, iron  Specifically need to evaluate rectum, has been noticing a rough area on self-exam, worsening bleeding, ongoing rectal pain  Oct 6 with Nandigam     Camie Furbish, PA-C [facility] 09/08/2024, 11:31 AM  Patient Care Team: Patient, No Pcp Per as PCP - General (General Practice)

## 2024-09-08 NOTE — Patient Instructions (Signed)
 You have been scheduled for a colonoscopy. Please follow written instructions given to you at your visit today.   If you use inhalers (even only as needed), please bring them with you on the day of your procedure.  DO NOT TAKE 7 DAYS PRIOR TO TEST- Trulicity (dulaglutide) Ozempic, Wegovy (semaglutide) Mounjaro (tirzepatide) Bydureon Bcise (exanatide extended release)  DO NOT TAKE 1 DAY PRIOR TO YOUR TEST Rybelsus (semaglutide) Adlyxin (lixisenatide) Victoza (liraglutide) Byetta (exanatide) ___________________________________________________________________________  _______________________________________________________  If your blood pressure at your visit was 140/90 or greater, please contact your primary care physician to follow up on this.  _______________________________________________________  If you are age 69 or older, your body mass index should be between 23-30. Your Body mass index is 25.7 kg/m. If this is out of the aforementioned range listed, please consider follow up with your Primary Care Provider.  If you are age 15 or younger, your body mass index should be between 19-25. Your Body mass index is 25.7 kg/m. If this is out of the aformentioned range listed, please consider follow up with your Primary Care Provider.   ________________________________________________________  The [place] providers would like to encourage you to use MYCHART to communicate with providers for non-urgent requests or questions.  Due to long hold times on the telephone, sending your provider a message by [facility] may be a faster and more efficient way to get a response.  Please allow 48 business hours for a response.  Please remember that this is for non-urgent requests.  _______________________________________________________  [facility] is using a team-based approach to care.  Your team is made up of your doctor and two to three APPS. Our APPS (Nurse Practitioners and  Physician Assistants) work with your physician to ensure care continuity for you. They are fully qualified to address your health concerns and develop a treatment plan. They communicate directly with your gastroenterologist to care for you. Seeing the Advanced Practice Practitioners on your physician's team can help you by facilitating care more promptly, often allowing for earlier appointments, access to diagnostic testing, procedures, and other specialty referrals.

## 2024-09-09 ENCOUNTER — Encounter: Payer: Self-pay | Admitting: Gastroenterology

## 2024-09-20 ENCOUNTER — Encounter: Payer: Self-pay | Admitting: Gastroenterology

## 2024-09-20 ENCOUNTER — Ambulatory Visit: Admitting: Gastroenterology

## 2024-09-20 VITALS — BP 124/77 | HR 64 | Temp 97.7°F | Resp 16 | Ht 69.0 in | Wt 174.0 lb

## 2024-09-20 DIAGNOSIS — K6289 Other specified diseases of anus and rectum: Secondary | ICD-10-CM

## 2024-09-20 DIAGNOSIS — K644 Residual hemorrhoidal skin tags: Secondary | ICD-10-CM

## 2024-09-20 DIAGNOSIS — K625 Hemorrhage of anus and rectum: Secondary | ICD-10-CM | POA: Diagnosis not present

## 2024-09-20 DIAGNOSIS — K648 Other hemorrhoids: Secondary | ICD-10-CM | POA: Diagnosis not present

## 2024-09-20 DIAGNOSIS — K626 Ulcer of anus and rectum: Secondary | ICD-10-CM | POA: Diagnosis not present

## 2024-09-20 MED ORDER — HYDROCORTISONE ACETATE 25 MG RE SUPP: 25.0000 mg | Freq: Two times a day (BID) | RECTAL | 1 refills | Status: AC

## 2024-09-20 MED ORDER — SODIUM CHLORIDE 0.9 % IV SOLN: 500.0000 mL | INTRAVENOUS | Status: DC

## 2024-09-20 NOTE — Progress Notes (Signed)
 Pt's states no medical or surgical changes since previsit or office visit.

## 2024-09-20 NOTE — Patient Instructions (Signed)

## 2024-09-20 NOTE — Progress Notes (Signed)
 Report given to PACU, vss

## 2024-09-20 NOTE — Op Note (Signed)
 [facility] Patient Name: Anthony Kline Procedure Date: 09/20/2024 7:57 AM MRN: [id number] Endoscopist: Gustav ALONSO Mcgee , MD, [id number] Age: 53 Referring MD:  Date of Birth: [birth year] Gender: Male Account #: [id number] Procedure:                Colonoscopy Indications:              Evaluation of unexplained GI bleeding presenting                            with Hematochezia, Rectal bleeding Medicines:                Monitored Anesthesia Care Procedure:                Pre-Anesthesia Assessment:                           - Prior to the procedure, a History and Physical                            was performed, and patient medications and                            allergies were reviewed. The patient's tolerance of                            previous anesthesia was also reviewed. The risks                            and benefits of the procedure and the sedation                            options and risks were discussed with the patient.                            All questions were answered, and informed consent                            was obtained. Prior Anticoagulants: The patient has                            taken no anticoagulant or antiplatelet agents. ASA                            Grade Assessment: II - A patient with mild systemic                            disease. After reviewing the risks and benefits,                            the patient was deemed in satisfactory condition to                            undergo the procedure.  After obtaining informed consent, the colonoscope                            was passed under direct vision. Throughout the                            procedure, the patient's blood pressure, pulse, and                            oxygen saturations were monitored continuously. The                            Olympus Scope [id number] was introduced through the                            anus and advanced  to the the cecum, identified by                            appendiceal orifice and ileocecal valve. The                            colonoscopy was performed without difficulty. The                            patient tolerated the procedure well. The quality                            of the bowel preparation was good. The terminal                            ileum, ileocecal valve, appendiceal orifice, and                            rectum were photographed. Scope In: 8:16:11 AM Scope Out: 8:35:19 AM Scope Withdrawal Time: 0 hours 13 minutes 3 seconds  Total Procedure Duration: 0 hours 19 minutes 8 seconds  Findings:                 The perianal and digital rectal examinations were                            normal.                           Discontinuous areas of ulcerated mucosa with                            stigmata of recent bleeding were present at the                            anus. Biopsies were taken with a cold forceps for                            histology.  Non-bleeding external and internal hemorrhoids were                            found during retroflexion. The hemorrhoids were                            small.                           The exam was otherwise without abnormality. Complications:            No immediate complications. Estimated Blood Loss:     Estimated blood loss was minimal. Impression:               - Mucosal ulceration at the anus. Biopsied.                           - Non-bleeding external and internal hemorrhoids.                           - The examination was otherwise normal. Recommendation:           - Resume previous diet.                           - Continue present medications.                           - Await pathology results.                           - Repeat colonoscopy date to be determined after                            pending pathology results are reviewed for                            surveillance  based on pathology results.                           - Use hydrocortisone suppository 25 mg 1 per rectum                            twice a day for 1 week. Ramiro Pangilinan V. Daronte Shostak, MD 09/20/2024 8:44:38 AM This report has been signed electronically.

## 2024-09-20 NOTE — Progress Notes (Signed)
 Called to room to assist during endoscopic procedure.  Patient ID and intended procedure confirmed with present staff. Received instructions for my participation in the procedure from the performing physician.

## 2024-09-20 NOTE — Progress Notes (Signed)
 [facility] History and Physical   Primary Care Physician:  Anthony Artist PARAS, MD   Reason for Procedure:  Rectal bleeding  Plan:     colonoscopy with possible interventions as needed     HPI: Anthony Kline is a very pleasant 53 y.o. male here for  colonoscopy for rectal bleeding.   The risks and benefits as well as alternatives of endoscopic procedure(s) have been discussed and reviewed. All questions answered. The patient agrees to proceed.    Past Medical History:  Diagnosis Date   Anal fissure    Anxiety     Past Surgical History:  Procedure Laterality Date   CYST REMOVAL NECK     SPHINCTEROTOMY Bilateral     Prior to Admission medications   Medication Sig Start Date End Date Taking? Authorizing Provider  AMBULATORY NON FORMULARY MEDICATION Place 1 Application rectally as needed. Medication Name: Dubicaine 1% and preparationH.   Yes [provider]  escitalopram (LEXAPRO) 10 MG tablet Take 20 mg by mouth daily.    Yes [provider]  FIBER PO Take by mouth.   Yes [provider]  Multiple Vitamin (MULTIVITAMIN) tablet Take 1 tablet by mouth daily.   Yes [provider]  Omega-3 Fatty Acids (FISH OIL PO) Take by mouth.   Yes [provider]  valACYclovir (VALTREX) 500 MG tablet Take 500 mg by mouth daily. 10/05/22  Yes [provider]  Emtricitabine-Tenofovir DF (TRUVADA PO) Take by mouth.    [provider]  latanoprost (XALATAN) 0.005 % ophthalmic solution Place 1 drop into both eyes at bedtime. 06/15/24   [provider]  Latanoprostene Bunod (VYZULTA) 0.024 % SOLN Place 1 drop into both eyes at bedtime. Patient not taking: Reported on 09/20/2024 10/04/22   [provider]  Tadalafil (CIALIS PO) Take by mouth. Patient taking differently: Take 0.5 tablets by mouth as needed.    [provider]    Current Outpatient Medications  Medication Sig Dispense Refill    AMBULATORY NON FORMULARY MEDICATION Place 1 Application rectally as needed. Medication Name: Dubicaine 1% and preparationH.     escitalopram (LEXAPRO) 10 MG tablet Take 20 mg by mouth daily.      FIBER PO Take by mouth.     Multiple Vitamin (MULTIVITAMIN) tablet Take 1 tablet by mouth daily.     Omega-3 Fatty Acids (FISH OIL PO) Take by mouth.     valACYclovir (VALTREX) 500 MG tablet Take 500 mg by mouth daily.     Emtricitabine-Tenofovir DF (TRUVADA PO) Take by mouth.     latanoprost (XALATAN) 0.005 % ophthalmic solution Place 1 drop into both eyes at bedtime.     Latanoprostene Bunod (VYZULTA) 0.024 % SOLN Place 1 drop into both eyes at bedtime. (Patient not taking: Reported on 09/20/2024)     Tadalafil (CIALIS PO) Take by mouth. (Patient taking differently: Take 0.5 tablets by mouth as needed.)     Current Facility-Administered Medications  Medication Dose Route Frequency Provider Last Rate Last Admin   0.9 %  sodium chloride infusion  500 mL Intravenous Continuous Ahnesty Finfrock V, MD        Allergies as of 09/20/2024 - Review Complete 09/20/2024  Allergen Reaction Noted   Augmentin [amoxicillin-pot clavulanate] Rash 02/27/2014    Family History  Problem Relation Age of Onset   Breast cancer Mother    Breast cancer Sister    Lung cancer Paternal Grandmother    Colon cancer Paternal Grandfather  Per patient cancer originated in lungs.   Colon polyps Paternal Grandfather    Cancer Paternal Grandfather        unsure of the type   Esophageal cancer Neg Hx    Rectal cancer Neg Hx    Stomach cancer Neg Hx     Social History   Socioeconomic History   Marital status: Single    Spouse name: Not on file   Number of children: Not on file   Years of education: Not on file   Highest education level: Not on file  Occupational History   Not on file  Tobacco Use   Smoking status: Former   Smokeless tobacco: Never  Substance and Sexual Activity   Alcohol use: Not  Currently   Drug use: No   Sexual activity: Yes    Partners: Male  Other Topics Concern   Not on file  Social History Narrative   Not on file   Social Drivers of Health   Financial Resource Strain: Not on file  Food Insecurity: Not on file  Transportation Needs: Not on file  Physical Activity: Not on file  Stress: Not on file  Social Connections: Not on file  Intimate Partner Violence: Not on file    Review of Systems:  All other review of systems negative except as mentioned in the HPI.  Physical Exam: Vital signs in last 24 hours: BP 128/87   Pulse 66   Temp 97.7 F (36.5 C) (Temporal)   Resp 17   Ht 5' 9 (1.753 m)   Wt 174 lb (78.9 kg)   SpO2 96%   BMI 25.70 kg/m  General:   Alert, NAD Lungs:  Clear .   Heart:  Regular rate and rhythm Abdomen:  Soft, nontender and nondistended. Neuro/Psych:  Alert and cooperative. Normal mood and affect. A and O x 3  Reviewed labs, radiology imaging, old records and pertinent past GI work up  Patient is appropriate for planned procedure(s) and anesthesia in an ambulatory setting   K. Veena Karren [name] , MD [phone]

## 2024-09-21 ENCOUNTER — Telehealth: Payer: Self-pay | Admitting: *Deleted

## 2024-09-21 NOTE — Telephone Encounter (Signed)
  Follow up Call-     09/20/2024    7:13 AM  Call back number  Post procedure Call Back phone  # [phone]  Permission to leave phone message Yes     Patient questions:  Do you have a fever, pain , or abdominal swelling? No. Pain Score  0 *  Have you tolerated food without any problems? Yes.    Have you been able to return to your normal activities? Yes.    Do you have any questions about your discharge instructions: Diet   No. Medications  No. Follow up visit  No.  Do you have questions or concerns about your Care? No.  Actions: * If pain score is 4 or above: No action needed, pain <4.pt admits to same discomfort rectal area as was having prior to procedure . Pt says he is using suppositories as ordered .

## 2024-09-22 ENCOUNTER — Ambulatory Visit: Payer: Self-pay | Admitting: Gastroenterology

## 2024-09-22 LAB — SURGICAL PATHOLOGY

## 2024-09-23 NOTE — Telephone Encounter (Signed)
 Attempted to reach patient.  LVM asking patient to return call.

## 2024-09-24 NOTE — Telephone Encounter (Signed)
 Reached patient and discussed results and recommendation for f/u appointment in 1-2 months.  Patient voiced understanding and opted to call back on another day to make appointment.

## 2024-10-01 ENCOUNTER — Telehealth: Payer: Self-pay | Admitting: Gastroenterology

## 2024-10-01 NOTE — Telephone Encounter (Signed)
 Spoke with patient. Appointment scheduled for 10/04/24 at 10:20 am. Patient has noted spots of blood come from the rectum. He can pass gas and will pass blood. Been going on for a while.

## 2024-10-01 NOTE — Telephone Encounter (Signed)
 Inbound call from patient stating that he had a colonoscopy on 10/6 and has been having pain since the procedure. Patient states it has got worse and is requesting a call back to discuss. Please advise.

## 2024-10-04 ENCOUNTER — Ambulatory Visit: Admitting: Physician Assistant

## 2024-10-04 ENCOUNTER — Encounter: Payer: Self-pay | Admitting: Physician Assistant

## 2024-10-04 ENCOUNTER — Other Ambulatory Visit

## 2024-10-04 VITALS — BP 126/76 | HR 68 | Ht 69.0 in | Wt 177.0 lb

## 2024-10-04 DIAGNOSIS — K6289 Other specified diseases of anus and rectum: Secondary | ICD-10-CM | POA: Diagnosis not present

## 2024-10-04 DIAGNOSIS — K625 Hemorrhage of anus and rectum: Secondary | ICD-10-CM

## 2024-10-04 DIAGNOSIS — K611 Rectal abscess: Secondary | ICD-10-CM | POA: Diagnosis not present

## 2024-10-04 LAB — BASIC METABOLIC PANEL WITH GFR
BUN: 22 mg/dL (ref 6–23)
CO2: 28 meq/L (ref 19–32)
Calcium: 9.1 mg/dL (ref 8.4–10.5)
Chloride: 105 meq/L (ref 96–112)
Creatinine, Ser: 0.77 mg/dL (ref 0.40–1.50)
GFR: 103.44 mL/min (ref >60.00)
Glucose, Bld: 82 mg/dL (ref 70–99)
Potassium: 4.1 meq/L (ref 3.5–5.1)
Sodium: 140 meq/L (ref 135–145)

## 2024-10-04 MED ORDER — METRONIDAZOLE 500 MG PO TABS: 500.0000 mg | ORAL_TABLET | Freq: Three times a day (TID) | ORAL | 0 refills | Status: DC

## 2024-10-04 MED ORDER — CIPROFLOXACIN HCL 500 MG PO TABS: 500.0000 mg | ORAL_TABLET | Freq: Two times a day (BID) | ORAL | 0 refills | Status: DC

## 2024-10-04 NOTE — Patient Instructions (Signed)
 Your provider has requested that you go to the basement level for lab work before leaving today. Press B on the elevator. The lab is located at the first door on the left as you exit the elevator.  We have sent the following medications to your pharmacy for you to pick up at your convenience: Cipro and Flagyl  You have been scheduled for a CT scan of the pelvis at [facility], 1st floor Radiology. You are scheduled on 10/07/24 at 5:30 pm . You should arrive 2 hours prior to your appointment time for registration and  to  drink oral contrast at 3:15 pm.  We are giving you 2 bottles of contrast today that you will need to drink before arriving for the exam. The solution may taste better if refrigerated so put them in the refrigerator when you get home, but do NOT add ice or any other liquid to this solution as that would dilute it. Shake well before drinking.   Please follow the written instructions below on the day of your exam:   1) Do not eat anything after 1:30 pm  (4 hours prior to your test)     You may take any medications as prescribed with a small amount of water, if necessary. If you take any of the following medications: METFORMIN, GLUCOPHAGE, GLUCOVANCE, AVANDAMET, RIOMET, FORTAMET, ACTOPLUS MET, JANUMET, GLUMETZA or METAGLIP, you MAY be asked to HOLD this medication 48 hours AFTER the exam.   The purpose of you drinking the oral contrast is to aid in the visualization of your intestinal tract. The contrast solution may cause some diarrhea. Depending on your individual set of symptoms, you may also receive an intravenous injection of x-ray contrast/dye. Plan on being at [facility] for 45 minutes or longer, depending on the type of exam you are having performed.   If you have any questions regarding your exam or if you need to reschedule, you may call [facility] at [phone] between the hours of 8:00 am and 5:00 pm, Monday-Friday.    Due to recent changes in  healthcare laws, you may see the results of your imaging and laboratory studies on MyChart before your provider has had a chance to review them.  We understand that in some cases there may be results that are confusing or concerning to you. Not all laboratory results come back in the same time frame and the provider may be waiting for multiple results in order to interpret others.  Please give us  48 hours in order for your provider to thoroughly review all the results before contacting the office for clarification of your results.   Thank you for choosing me and [facility].  Delon Failing, PA-C

## 2024-10-04 NOTE — Progress Notes (Signed)
 Chief Complaint: Rectal bleeding  HPI:    [name] is a 53 year old Caucasian male with a past medical history as listed below including anxiety, prior alcohol abuse, AAA, high risk sexual behavior on medications for HIV prophylaxis, anal fissure, who was referred to me by Delayne Artist PARAS, MD for a complaint of rectal bleeding.      January 2023 colonoscopy with [facility].  No mention of hemorrhoids.    09/04/2024 CBC with an elevated MCV and otherwise normal, CMP with an elevated glucose of 100 and otherwise normal, iron studies normal.    09/04/2024 office visit with Lauraine Pa, PA-C for rectal pain.  At that visit discussed of bilateral sphincterotomy 10 to 15 years ago for anal fissure which helped.  He was on Preparation H.  Chronic constipation.    09/20/2024 colonoscopy with Dr. Shila done for rectal bleeding with mucosal ulceration at the anus and nonbleeding external and internal hemorrhoids.  Patient started on Hydrocortisone suppositories 25 mg 1 per rectum twice a day for a week.  Pathology showed anorectal mucosa with ulceration and granulation tissue.  Dr. Nandigam recommended a follow-up visit in 1 to 2 months with rectal exam and anoscopy.  If persistent ulceration plan for repeat flex sig with biopsies.    Today, patient presents to clinic and tells me he used the Hydrocortisone suppositories twice daily for a week and that did seem to help slightly when he was using them, but afterwards everything came back and has been sometimes worse, he was still experiencing the pain in the same spot, 8-9/10, occasionally sees some yellowish discharge when wiping, continue sitz bath's and anything else he can find to help, also taking Advil 3 times daily.  Still sees blood and even passing gas, not to soak through his underwear.    Denies fever or chills.  Past Medical History:  Diagnosis Date   Anal fissure    Anxiety     Past Surgical History:  Procedure Laterality Date   CYST REMOVAL  NECK     SPHINCTEROTOMY Bilateral     Current Outpatient Medications  Medication Sig Dispense Refill   AMBULATORY NON FORMULARY MEDICATION Place 1 Application rectally as needed. Medication Name: Dubicaine 1% and preparationH.     Emtricitabine-Tenofovir DF (TRUVADA PO) Take by mouth.     escitalopram (LEXAPRO) 10 MG tablet Take 20 mg by mouth daily.      FIBER PO Take by mouth.     hydrocortisone (ANUSOL-HC) 25 MG suppository Place 1 suppository (25 mg total) rectally 2 (two) times daily. 14 suppository 1   latanoprost (XALATAN) 0.005 % ophthalmic solution Place 1 drop into both eyes at bedtime.     Latanoprostene Bunod (VYZULTA) 0.024 % SOLN Place 1 drop into both eyes at bedtime. (Patient not taking: Reported on 09/20/2024)     Multiple Vitamin (MULTIVITAMIN) tablet Take 1 tablet by mouth daily.     Omega-3 Fatty Acids (FISH OIL PO) Take by mouth.     Tadalafil (CIALIS PO) Take by mouth. (Patient taking differently: Take 0.5 tablets by mouth as needed.)     valACYclovir (VALTREX) 500 MG tablet Take 500 mg by mouth daily.     No current facility-administered medications for this visit.    Allergies as of 10/04/2024 - Review Complete 09/20/2024  Allergen Reaction Noted   Augmentin [amoxicillin-pot clavulanate] Rash 02/27/2014    Family History  Problem Relation Age of Onset   Breast cancer Mother    Breast cancer  Sister    Lung cancer Paternal Grandmother    Colon cancer Paternal Grandfather        Per patient cancer originated in lungs.   Colon polyps Paternal Grandfather    Cancer Paternal Grandfather        unsure of the type   Esophageal cancer Neg Hx    Rectal cancer Neg Hx    Stomach cancer Neg Hx     Social History   Socioeconomic History   Marital status: Single    Spouse name: Not on file   Number of children: Not on file   Years of education: Not on file   Highest education level: Not on file  Occupational History   Not on file  Tobacco Use   Smoking  status: Former   Smokeless tobacco: Never  Substance and Sexual Activity   Alcohol use: Not Currently   Drug use: No   Sexual activity: Yes    Partners: Male  Other Topics Concern   Not on file  Social History Narrative   Not on file   Social Drivers of Health   Financial Resource Strain: Not on file  Food Insecurity: Not on file  Transportation Needs: Not on file  Physical Activity: Not on file  Stress: Not on file  Social Connections: Not on file  Intimate Partner Violence: Not on file    Review of Systems:    Constitutional: No weight loss, fever or chills Cardiovascular: No chest pain Respiratory: No SOB  Gastrointestinal: See HPI and otherwise negative   Physical Exam:  Vital signs: BP 126/76 (BP Location: Left Arm, Patient Position: Sitting, Cuff Size: Normal)   Pulse 68   Ht 5' 9 (1.753 m)   Wt 177 lb (80.3 kg)   BMI 26.14 kg/m    Constitutional:   Pleasant Caucasian male appears to be in NAD, Well developed, Well nourished, alert and cooperative Respiratory: Respirations even and unlabored. Lungs clear to auscultation bilaterally.   No wheezes, crackles, or rhonchi.  Cardiovascular: Normal S1, S2. No MRG. Regular rate and rhythm. No peripheral edema, cyanosis or pallor.  Gastrointestinal:  Soft, nondistended, nontender. No rebound or guarding. Normal bowel sounds. No appreciable masses or hepatomegaly. Rectal: External: Small healing HSV ulcers; internal: Minimal fluctuance on the right side of the rectum with palpation, TTP; and anoscopy: Abscess on the right side of the rectum with purulence and discomfort Psychiatric: Oriented to person, place and time. Demonstrates good judgement and reason without abnormal affect or behaviors.  RELEVANT LABS AND IMAGING: CBC    Component Value Date/Time   WBC 7.3 09/08/2024 1221   RBC 4.29 09/08/2024 1221   HGB 15.6 09/08/2024 1221   HGB 13.7 10/07/2018 1609   HCT 46.2 09/08/2024 1221   PLT 302.0 09/08/2024 1221    PLT 291 10/07/2018 1609   MCV 107.6 (H) 09/08/2024 1221   MCH 35.4 (H) 10/07/2018 1609   MCHC 33.8 09/08/2024 1221   RDW 12.6 09/08/2024 1221   LYMPHSABS 3.1 09/08/2024 1221   MONOABS 0.5 09/08/2024 1221   EOSABS 0.0 09/08/2024 1221   BASOSABS 0.0 09/08/2024 1221    CMP     Component Value Date/Time   NA 139 09/08/2024 1221   K 4.8 09/08/2024 1221   CL 104 09/08/2024 1221   CO2 30 09/08/2024 1221   GLUCOSE 100 (H) 09/08/2024 1221   BUN 15 09/08/2024 1221   CREATININE 0.82 09/08/2024 1221   CALCIUM 10.1 09/08/2024 1221   PROT 7.8 09/08/2024 1221  ALBUMIN 5.0 09/08/2024 1221   AST 21 09/08/2024 1221   ALT 19 09/08/2024 1221   ALKPHOS 87 09/08/2024 1221   BILITOT 0.9 09/08/2024 1221   GFRNONAA >60 10/07/2018 1609   GFRAA >60 10/07/2018 1609    Assessment: 1.  Rectal pain, bleeding and discharge: Recent colonoscopy 09/20/2024 with ulcerations in the rectum, treated with Hydrocortisone suppositories twice daily times a week, which minimally helped, symptoms now back and worse, pain feels different per patient, rectal abscess on exam to the right side of the rectum (confirmed with Dr. Charlanne at time of appointment)  Plan: 1.  Ordered a CT of the pelvis with contrast for further evaluation.  If pain persists after antibiotics we will need to pursue an MRI which is often not covered by insurance without CT first. 2.  Ordered BMP for contrast clearance as above 3.  Prescribed Metronidazole 500 mg 3 times daily x 7 days #21 4.  Prescribed Ciprofloxacin 500 mg twice daily x 7 days #14 5.  Patient to follow in clinic in 3 to 4 weeks with Dr. Nandigam or myself.  May need follow-up flex sig pending course.  Anthony Failing, PA-C [facility] 10/04/2024, 8:10 AM  Cc: Madden, Evan J, MD

## 2024-10-07 ENCOUNTER — Ambulatory Visit (HOSPITAL_COMMUNITY)
Admission: RE | Admit: 2024-10-07 | Discharge: 2024-10-07 | Disposition: A | Discharge status: Home / Self Care | Source: Ambulatory Visit | Attending: Physician Assistant | Admitting: Physician Assistant

## 2024-10-07 DIAGNOSIS — K611 Rectal abscess: Secondary | ICD-10-CM | POA: Diagnosis present

## 2024-10-07 DIAGNOSIS — K625 Hemorrhage of anus and rectum: Secondary | ICD-10-CM | POA: Insufficient documentation

## 2024-10-07 MED ORDER — IOHEXOL 9 MG/ML PO SOLN
1000.0000 mL | ORAL | Status: AC
  Administered 2024-10-07: 1000 mL via ORAL

## 2024-10-07 MED ORDER — IOHEXOL 300 MG/ML  SOLN
100.0000 mL | Freq: Once | INTRAMUSCULAR | Status: AC | PRN
  Administered 2024-10-07: 100 mL via INTRAVENOUS

## 2024-10-11 ENCOUNTER — Telehealth: Payer: Self-pay

## 2024-10-11 DIAGNOSIS — K6289 Other specified diseases of anus and rectum: Secondary | ICD-10-CM

## 2024-10-11 DIAGNOSIS — R935 Abnormal findings on diagnostic imaging of other abdominal regions, including retroperitoneum: Secondary | ICD-10-CM

## 2024-10-11 NOTE — Telephone Encounter (Signed)
 Received a call from Diane at [facility]. Pt's pelvis CT results came back. Please see impression below:   IMPRESSION: 1. Rectal wall thickening with asymmetric left-sided thickening up to 3 cm and numerous small perirectal lymph nodes up to 8 mm. Neoplasm cannot be excluded. Proctitis is also a consideration. 2. No evidence for abscess. 3. Moderate-sized fat-containing left inguinal hernia.

## 2024-10-11 NOTE — Telephone Encounter (Signed)
 Attempted to reach patient. No answer, left VM for patient to return call.

## 2024-10-11 NOTE — Telephone Encounter (Signed)
 Please have patient use benefiber 1 tablespoon TID, Miralax 1 capful daily. Canasa suppository at bedtime daily X 4 weeks and repeat flex sig with biopsies in 4 weeks. Initial biopsies I took were negative for any precancerous changes or neoplasm. Please inform patient the results. Thanks

## 2024-10-12 MED ORDER — MESALAMINE 1000 MG RE SUPP: 1000.0000 mg | Freq: Every day | RECTAL | 0 refills | Status: AC

## 2024-10-12 NOTE — Telephone Encounter (Signed)
 Attempted to reach patient. No answer, left VM for patient to return call.

## 2024-10-12 NOTE — Telephone Encounter (Signed)
 Pt returned call. Discussed provider recommendations regarding Miralax, Benefiber, and Canasa suppositories. Scheduled pt for flex sig on 11/26/2024 at 1500. Instructions sent via my chart and placed in outgoing mail. Rx for suppositories sent to pharmacy of choice, which was verified with the pt. He verbalized understanding of all instructions.

## 2024-11-03 ENCOUNTER — Encounter: Payer: Self-pay | Admitting: Gastroenterology

## 2024-11-03 ENCOUNTER — Ambulatory Visit (INDEPENDENT_AMBULATORY_CARE_PROVIDER_SITE_OTHER): Admitting: Gastroenterology

## 2024-11-03 VITALS — BP 118/78 | HR 69 | Ht 69.0 in | Wt 176.4 lb

## 2024-11-03 DIAGNOSIS — K641 Second degree hemorrhoids: Secondary | ICD-10-CM | POA: Diagnosis not present

## 2024-11-03 DIAGNOSIS — K626 Ulcer of anus and rectum: Secondary | ICD-10-CM

## 2024-11-03 DIAGNOSIS — R933 Abnormal findings on diagnostic imaging of other parts of digestive tract: Secondary | ICD-10-CM

## 2024-11-03 NOTE — Progress Notes (Signed)
 Anthony Kline    [id number]    26-Jul-1971  Primary Care Physician:Madden, Evan J, MD  Referring Physician: Delayne Artist PARAS, MD [address] [place],  [place] [address]   Chief complaint:  Rectal bleeding  Discussed the use of AI scribe software for clinical note transcription with the patient, who gave verbal consent to proceed.  History of Present Illness Anthony Kline is a 53 year old male with a history of anal ulcer and hemorrhoids who presents with rectal pain and ulcer healing follow-up.  Anal ulcer and rectal pain - Significant anal ulcer identified during previous colonoscopy - Presenting for follow-up to assess healing and ongoing rectal pain - Rectal pain is sharp and localized - Pain has improved since initial presentation - No rectal bleeding since last visit - No fever, constant pain, drainage, or skin changes such as redness or swelling  Bowel habits and laxative use - Bowel movements are normal but slightly loose, attributed to Benefiber use - No constipation - Previously used Benefiber packets three times daily but currently not taking any due to running out - Previously used Miralax, one capful nightly, to maintain soft bowel movements  Hemorrhoidal symptoms - History of hemorrhoids - Belief that hemorrhoids may be contributing to current symptoms  Rectal medication use - Currently using Canasa suppositories - Uncertain of exact number of Canasa suppositories remaining, estimates about one week supply left  CT pelvis with contrast 10/07/24 1. Rectal wall thickening with asymmetric left-sided thickening up to 3 cm and numerous small perirectal lymph nodes up to 8 mm. Neoplasm cannot be excluded. Proctitis is also a consideration. 2. No evidence for abscess. 3. Moderate-sized fat-containing left inguinal hernia.   Colonoscopy 09/20/24  - Mucosal ulceration at the anus. Biopsied. - Non- bleeding external and internal hemorrhoids. -  The examination was otherwise normal.   Outpatient Encounter Medications as of 11/03/2024  Medication Sig   AMBULATORY NON FORMULARY MEDICATION Place 1 Application rectally as needed. Medication Name: Dubicaine 1% and preparationH.   ciprofloxacin (CIPRO) 500 MG tablet Take 1 tablet (500 mg total) by mouth 2 (two) times daily.   Emtricitabine-Tenofovir DF (TRUVADA PO) Take by mouth.   escitalopram (LEXAPRO) 10 MG tablet Take 20 mg by mouth daily.    FIBER PO Take by mouth.   hydrocortisone (ANUSOL-HC) 25 MG suppository Place 1 suppository (25 mg total) rectally 2 (two) times daily.   latanoprost (XALATAN) 0.005 % ophthalmic solution Place 1 drop into both eyes at bedtime.   Latanoprostene Bunod (VYZULTA) 0.024 % SOLN Place 1 drop into both eyes at bedtime.   mesalamine (CANASA) 1000 MG suppository Place 1 suppository (1,000 mg total) rectally at bedtime.   metroNIDAZOLE (FLAGYL) 500 MG tablet Take 1 tablet (500 mg total) by mouth 3 (three) times daily.   Multiple Vitamin (MULTIVITAMIN) tablet Take 1 tablet by mouth daily.   Omega-3 Fatty Acids (FISH OIL PO) Take by mouth.   Tadalafil (CIALIS PO) Take by mouth. (Patient taking differently: Take 0.5 tablets by mouth as needed.)   valACYclovir (VALTREX) 500 MG tablet Take 500 mg by mouth daily.   No facility-administered encounter medications on file as of 11/03/2024.    Allergies as of 11/03/2024 - Review Complete 11/03/2024  Allergen Reaction Noted   Augmentin [amoxicillin-pot clavulanate] Rash 02/27/2014    Past Medical History:  Diagnosis Date   Anal fissure    Anxiety     Past Surgical History:  Procedure Laterality Date   CYST REMOVAL NECK     SPHINCTEROTOMY Bilateral     Family History  Problem Relation Age of Onset   Breast cancer Mother    Breast cancer Sister    Lung cancer Paternal Grandmother    Colon cancer Paternal Grandfather        Per patient cancer originated in lungs.   Colon polyps Paternal Grandfather     Cancer Paternal Grandfather        unsure of the type   Esophageal cancer Neg Hx    Rectal cancer Neg Hx    Stomach cancer Neg Hx     Social History   Socioeconomic History   Marital status: Single    Spouse name: Not on file   Number of children: Not on file   Years of education: Not on file   Highest education level: Not on file  Occupational History   Not on file  Tobacco Use   Smoking status: Former   Smokeless tobacco: Never  Substance and Sexual Activity   Alcohol use: Not Currently   Drug use: No   Sexual activity: Yes    Partners: Male  Other Topics Concern   Not on file  Social History Narrative   Not on file   Social Drivers of Health   Financial Resource Strain: Not on file  Food Insecurity: Not on file  Transportation Needs: Not on file  Physical Activity: Not on file  Stress: Not on file  Social Connections: Not on file  Intimate Partner Violence: Not on file      Review of systems: All other review of systems negative except as mentioned in the HPI.   Physical Exam: Vitals:   11/03/24 1415  BP: 118/78  Pulse: 69   Body mass index is 26.05 kg/m. Gen:      No acute distress HEENT:  sclera anicteric Abd:      soft, non-tender; no palpable masses, no distension Ext:    No edema Neuro: alert and oriented x 3 Psych: normal mood and affect Rectal exam: Normal anal sphincter tone, no anal fissure or external hemorrhoids Anoscopy:medium size Grade 2 internal hemorrhoids, no active bleeding, normal dentate line, no visible nodules  Data Reviewed:  Reviewed labs, radiology imaging, old records and pertinent past GI work up   Assessment & Plan Anal canal ulcer with associated inflammation The anal canal ulcer is healing but still causing sharp pain at the same spot. Biopsies showed no precancerous or cancerous changes. The ulcer was deep, and inflammation persists, contributing to pain. No abscess is present, and no fistula to consider  [place]. Continued healing is expected, but if pain persists with nonhealing ulcer, surgical intervention may be necessary. - Complete current supply of Canasa  suppositories daily at bedtime to complete course. - Will perform Flexig in December to assess ulcer healing and obtain biopsies to exclude neoplastic lesion - Monitor for signs of infection or worsening symptoms, such as fever, constant pain, or drainage, and contact the clinic if these occur. - Will consider referral to colorectal surgeon if pain persists after Flexig, or has any signs of anorectal fistula.  Hemorrhoids Present but not causing significant issues. He is large but not currently requiring removal unless symptoms worsen. - Will consider hemorrhoid band ligation if hemorrhoids become symptomatic.  Constipation Managed with fiber supplements and Miralax. Bowel movements are currently loose due to fiber intake, but this is preferable to hard stools that could exacerbate anal pain. -  Continue Benefiber or Metamucil twice daily to manage bowel movements. - Adjust Miralax dosage as needed to achieve soft bowel movements once or twice daily. - Consider dietary modifications, including increased intake of oatmeal, nuts, vegetables, and fruits.    This visit required 30 minutes of patient care (this includes precharting, chart review, review of results, face-to-face time used for counseling as well as treatment plan and follow-up. The patient was provided an opportunity to ask questions and all were answered. The patient agreed with the plan and demonstrated an understanding of the instructions.  Anthony Kline , MD    CC: Madden, Evan J, MD

## 2024-11-03 NOTE — Patient Instructions (Addendum)
 _______________________________________________________  If your blood pressure at your visit was 140/90 or greater, please contact your primary care physician to follow up on this.  _______________________________________________________  If you are age 52 or older, your body mass index should be between 23-30. Your Body mass index is 26.05 kg/m. If this is out of the aforementioned range listed, please consider follow up with your Primary Care Provider.  If you are age 41 or younger, your body mass index should be between 19-25. Your Body mass index is 26.05 kg/m. If this is out of the aformentioned range listed, please consider follow up with your Primary Care Provider.   ________________________________________________________  The [facility] providers would like to encourage you to use MYCHART to communicate with providers for non-urgent requests or questions.  Due to long hold times on the telephone, sending your provider a message by [facility] may be a faster and more efficient way to get a response.  Please allow 48 business hours for a response.  Please remember that this is for non-urgent requests.  _______________________________________________________  [facility] is using a team-based approach to care.  Your team is made up of your doctor and two to three APPS. Our APPS (Nurse Practitioners and Physician Assistants) work with your physician to ensure care continuity for you. They are fully qualified to address your health concerns and develop a treatment plan. They communicate directly with your gastroenterologist to care for you. Seeing the Advanced Practice Practitioners on your physician's team can help you by facilitating care more promptly, often allowing for earlier appointments, access to diagnostic testing, procedures, and other specialty referrals.    VISIT SUMMARY:  Today, we discussed your ongoing rectal pain and the healing progress of your anal ulcer. We  also reviewed your bowel habits, hemorrhoidal symptoms, and current use of rectal medications.  YOUR PLAN:  ANAL CANAL ULCER WITH ASSOCIATED INFLAMMATION: The ulcer is healing but still causing sharp pain. No signs of infection or abscess were found. -Complete your current supply of Canasa  suppositories. -We will perform a Flexsig in December to assess healing and check for precancerous changes. -Monitor for signs of infection or worsening symptoms, such as fever, constant pain, or drainage, and contact the clinic if these occur. -If pain persists after the [facility], we may refer you to a colorectal surgeon.  HEMORRHOIDS: Present but not causing significant issues currently. -Monitor hemorrhoids for any increase in size or symptoms. -We will consider surgical removal if hemorrhoids become symptomatic.  CONSTIPATION: Managed with fiber supplements and Miralax. Bowel movements are currently loose due to fiber intake. -Continue Benefiber or Metamucil twice daily to manage bowel movements. -Adjust Miralax dosage as needed to achieve soft bowel movements once or twice daily. -Consider dietary modifications, including increased intake of oatmeal, nuts, vegetables, and fruits.  You have been scheduled for a flexible sigmoidoscopy. Please follow the written instructions given to you at your visit today.  If you use inhalers (even only as needed), please bring them with you on the day of your procedure.  DO NOT TAKE 7 DAYS PRIOR TO TEST- Trulicity (dulaglutide) Ozempic, Wegovy (semaglutide) Mounjaro, Zepbound (tirzepatide) Bydureon Bcise (exanatide extended release)  DO NOT TAKE 1 DAY PRIOR TO YOUR TEST Rybelsus (semaglutide) Adlyxin (lixisenatide) Victoza (liraglutide) Byetta (exanatide) ___________________________________________________________________________  Due to recent changes in healthcare laws, you may see the results of your imaging and laboratory studies on MyChart before your  provider has had a chance to review them.  We understand that in some cases there  may be results that are confusing or concerning to you. Not all laboratory results come back in the same time frame and the provider may be waiting for multiple results in order to interpret others.  Please give us  48 hours in order for your provider to thoroughly review all the results before contacting the office for clarification of your results.   Thank you for entrusting me with your care and choosing [facility].  Dr Shila

## 2024-11-26 ENCOUNTER — Encounter: Payer: Self-pay | Admitting: Gastroenterology

## 2024-11-26 ENCOUNTER — Ambulatory Visit: Admitting: Gastroenterology

## 2024-11-26 VITALS — BP 106/71 | HR 59 | Temp 97.9°F | Resp 19 | Ht 69.0 in | Wt 176.6 lb

## 2024-11-26 DIAGNOSIS — K626 Ulcer of anus and rectum: Secondary | ICD-10-CM

## 2024-11-26 DIAGNOSIS — K648 Other hemorrhoids: Secondary | ICD-10-CM | POA: Diagnosis present

## 2024-11-26 MED ORDER — SODIUM CHLORIDE 0.9 % IV SOLN: 500.0000 mL | Freq: Once | INTRAVENOUS | Status: DC

## 2024-11-26 NOTE — Patient Instructions (Signed)
 Discharge instructions given. Handout on Hemorrhoids. Resume previous medications,. YOU HAD AN ENDOSCOPIC PROCEDURE TODAY AT THE [facility]:   Refer to the procedure report that was given to you for any specific questions about what was found during the examination.  If the procedure report does not answer your questions, please call your gastroenterologist to clarify.  If you requested that your care partner not be given the details of your procedure findings, then the procedure report has been included in a sealed envelope for you to review at your convenience later.  YOU SHOULD EXPECT: Some feelings of bloating in the abdomen. Passage of more gas than usual.  Walking can help get rid of the air that was put into your GI tract during the procedure and reduce the bloating. If you had a lower endoscopy (such as a colonoscopy or flexible sigmoidoscopy) you may notice spotting of blood in your stool or on the toilet paper. If you underwent a bowel prep for your procedure, you may not have a normal bowel movement for a few days.  Please Note:  You might notice some irritation and congestion in your nose or some drainage.  This is from the oxygen used during your procedure.  There is no need for concern and it should clear up in a day or so.  SYMPTOMS TO REPORT IMMEDIATELY:  Following lower endoscopy (colonoscopy or flexible sigmoidoscopy):  Excessive amounts of blood in the stool  Significant tenderness or worsening of abdominal pains  Swelling of the abdomen that is new, acute  Fever of 100F or higher   For urgent or emergent issues, a gastroenterologist can be reached at any hour by calling [phone]. Do not use MyChart messaging for urgent concerns.    DIET:  We do recommend a small meal at first, but then you may proceed to your regular diet.  Drink plenty of fluids but you should avoid alcoholic beverages for 24 hours.  ACTIVITY:  You should plan to take it easy for the  rest of today and you should NOT DRIVE or use heavy machinery until tomorrow (because of the sedation medicines used during the test).    FOLLOW UP: Our staff will call the number listed on your records the next business day following your procedure.  We will call around 7:15- 8:00 am to check on you and address any questions or concerns that you may have regarding the information given to you following your procedure. If we do not reach you, we will leave a message.     If any biopsies were taken you will be contacted by phone or by letter within the next 1-3 weeks.  Please call us  at [phone] if you have not heard about the biopsies in 3 weeks.    SIGNATURES/CONFIDENTIALITY: You and/or your care partner have signed paperwork which will be entered into your electronic medical record.  These signatures attest to the fact that that the information above on your After Visit Summary has been reviewed and is understood.  Full responsibility of the confidentiality of this discharge information lies with you and/or your care-partner.

## 2024-11-26 NOTE — Progress Notes (Signed)
 [facility] History and Physical   Primary Care Physician:  Delayne Artist PARAS, MD   Reason for Procedure:  F/u of large rectal ulcer  Plan:    Flex sig with possible interventions as needed     HPI: Anthony Kline is a very pleasant 53 y.o. male here for flex sig for follow up of large rectal ulcer , r/o malignancy  The risks and benefits as well as alternatives of endoscopic procedure(s) have been discussed and reviewed.  The patient was provided an opportunity to ask questions and all were answered. The patient agreed with the plan and demonstrated an understanding of the instructions.   Past Medical History:  Diagnosis Date   Anal fissure    Anxiety     Past Surgical History:  Procedure Laterality Date   CYST REMOVAL NECK     SPHINCTEROTOMY Bilateral     Prior to Admission medications  Medication Sig Start Date End Date Taking? Authorizing Provider  Emtricitabine-Tenofovir DF (TRUVADA PO) Take by mouth.   Yes [provider]  escitalopram (LEXAPRO) 20 MG tablet Take 20 mg by mouth daily. 11/03/24  Yes [provider]  FIBER PO Take by mouth.   Yes [provider]  Latanoprostene Bunod (VYZULTA) 0.024 % SOLN Place 1 drop into both eyes at bedtime. 10/04/22  Yes [provider]  valACYclovir (VALTREX) 500 MG tablet Take 500 mg by mouth daily. 10/05/22  Yes [provider]  AMBULATORY NON FORMULARY MEDICATION Place 1 Application rectally as needed. Medication Name: Dubicaine 1% and preparationH.    [provider]  hydrocortisone  (ANUSOL -HC) 25 MG suppository Place 1 suppository (25 mg total) rectally 2 (two) times daily. 09/20/24   Sally-Ann Cutbirth V, MD  latanoprost (XALATAN) 0.005 % ophthalmic solution Place 1 drop into both eyes at bedtime. Patient not taking: Reported on 11/26/2024 06/15/24   [provider]  mesalamine  (CANASA ) 1000 MG suppository Place 1 suppository (1,000 mg total) rectally at  bedtime. 10/12/24   Hermione Havlicek V, MD  Multiple Vitamin (MULTIVITAMIN) tablet Take 1 tablet by mouth daily.    [provider]  Omega-3 Fatty Acids (FISH OIL PO) Take by mouth.    [provider]  Tadalafil (CIALIS PO) Take by mouth. Patient taking differently: Take 0.5 tablets by mouth as needed.    [provider]    Current Outpatient Medications  Medication Sig Dispense Refill   Emtricitabine-Tenofovir DF (TRUVADA PO) Take by mouth.     escitalopram (LEXAPRO) 20 MG tablet Take 20 mg by mouth daily.     FIBER PO Take by mouth.     Latanoprostene Bunod (VYZULTA) 0.024 % SOLN Place 1 drop into both eyes at bedtime.     valACYclovir (VALTREX) 500 MG tablet Take 500 mg by mouth daily.     AMBULATORY NON FORMULARY MEDICATION Place 1 Application rectally as needed. Medication Name: Dubicaine 1% and preparationH.     hydrocortisone  (ANUSOL -HC) 25 MG suppository Place 1 suppository (25 mg total) rectally 2 (two) times daily. 14 suppository 1   latanoprost (XALATAN) 0.005 % ophthalmic solution Place 1 drop into both eyes at bedtime. (Patient not taking: Reported on 11/26/2024)     mesalamine  (CANASA ) 1000 MG suppository Place 1 suppository (1,000 mg total) rectally at bedtime. 30 suppository 0   Multiple Vitamin (MULTIVITAMIN) tablet Take 1 tablet by mouth daily.     Omega-3 Fatty Acids (FISH OIL PO) Take by mouth.     Tadalafil (CIALIS PO) Take by  mouth. (Patient taking differently: Take 0.5 tablets by mouth as needed.)     Current Facility-Administered Medications  Medication Dose Route Frequency Provider Last Rate Last Admin   0.9 %  sodium chloride  infusion  500 mL Intravenous Once Areeba Sulser V, MD        Allergies as of 11/26/2024 - Review Complete 11/26/2024  Allergen Reaction Noted   Augmentin [amoxicillin-pot clavulanate] Rash 02/27/2014    Family History  Problem Relation Age of Onset   Breast cancer Mother    Breast cancer Sister    Lung  cancer Paternal Grandmother    Colon cancer Paternal Grandfather        Per patient cancer originated in lungs.   Colon polyps Paternal Grandfather    Cancer Paternal Grandfather        unsure of the type   Esophageal cancer Neg Hx    Rectal cancer Neg Hx    Stomach cancer Neg Hx     Social History   Socioeconomic History   Marital status: Single    Spouse name: Not on file   Number of children: Not on file   Years of education: Not on file   Highest education level: Not on file  Occupational History   Not on file  Tobacco Use   Smoking status: Former   Smokeless tobacco: Never  Vaping Use   Vaping status: Never Used  Substance and Sexual Activity   Alcohol use: Not Currently   Drug use: No   Sexual activity: Yes    Partners: Male  Other Topics Concern   Not on file  Social History Narrative   Not on file   Social Drivers of Health   Tobacco Use: Medium Risk (11/26/2024)   Patient History    Smoking Tobacco Use: Former    Smokeless Tobacco Use: Never    Passive Exposure: Not on Actuary Strain: Not on file  Food Insecurity: Not on file  Transportation Needs: Not on file  Physical Activity: Not on file  Stress: Not on file  Social Connections: Not on file  Intimate Partner Violence: Not on file  Depression (EYV7-0): Not on file  Alcohol Screen: Not on file  Housing: Not on file  Utilities: Not on file  Health Literacy: Not on file    Review of Systems:  All other review of systems negative except as mentioned in the HPI.  Physical Exam: Vital signs in last 24 hours: BP (!) 134/91   Pulse (!) 53   Temp 97.9 F (36.6 C)   Resp 16   Ht 5' 9 (1.753 m)   Wt 176 lb 9.6 oz (80.1 kg)   SpO2 97%   BMI 26.08 kg/m  General:   Alert, NAD Lungs:  Clear .   Heart:  Regular rate and rhythm Abdomen:  Soft, nontender and nondistended. Neuro/Psych:  Alert and cooperative. Normal mood and affect. A and O x 3  Reviewed labs, radiology  imaging, old records and pertinent past GI work up  Patient is appropriate for planned procedure(s) and anesthesia in an ambulatory setting   K. Veena Clarabelle Oscarson , MD [phone]

## 2024-11-26 NOTE — Progress Notes (Signed)
 A/o x 3, VSS, good SR's, pleased with anesthesia, report to RN

## 2024-11-26 NOTE — Op Note (Signed)
 [facility] Patient Name: Anthony Kline Procedure Date: 11/26/2024 3:20 PM MRN: [id number] Endoscopist: Gustav ALONSO Mcgee , MD, [id number] Age: 53 Referring MD:  Date of Birth: 27-Feb-1971 Gender: Male Account #: [id number] Procedure:                Flexible Sigmoidoscopy Indications:              Exclusion of chronic ulcerative proctitis, Rectal                            ulcerated with raised edges, exclude malignancy Medicines:                Monitored Anesthesia Care Procedure:                Pre-Anesthesia Assessment:                           - Prior to the procedure, a History and Physical                            was performed, and patient medications and                            allergies were reviewed. The patient's tolerance of                            previous anesthesia was also reviewed. The risks                            and benefits of the procedure and the sedation                            options and risks were discussed with the patient.                            All questions were answered, and informed consent                            was obtained. Prior Anticoagulants: The patient has                            taken no anticoagulant or antiplatelet agents. ASA                            Grade Assessment: I - A normal, healthy patient.                            After reviewing the risks and benefits, the patient                            was deemed in satisfactory condition to undergo the                            procedure.  After obtaining informed consent, the scope was                            passed under direct vision. The [id number]-[id number] Slim SN                            [id number] was introduced through the anus and                            advanced to the the descending colon. The flexible                            sigmoidoscopy was accomplished without difficulty.                            The patient  tolerated the procedure well. The                            quality of the bowel preparation was good. Scope In: 3:43:43 PM Scope Out: 3:47:46 PM Total Procedure Duration: 0 hours 4 minutes 3 seconds  Findings:                 The perianal and digital rectal examinations were                            normal.                           Non-bleeding internal hemorrhoids were found during                            retroflexion. The hemorrhoids were small.                           The exam was otherwise without abnormality. Complications:            No immediate complications. Estimated Blood Loss:     Estimated blood loss: none. Impression:               - Non-bleeding internal hemorrhoids.                           - The examination was otherwise normal.                           - No specimens collected. Recommendation:           - Resume previous diet.                           - Continue present medications.                           - Repeat colonoscopy in 10 years for surveillance. Deisha Stull V. Stanly Si, MD 11/26/2024 3:55:19 PM This report has been signed electronically.

## 2024-11-26 NOTE — Progress Notes (Signed)
 VS by EJ

## 2024-11-29 ENCOUNTER — Telehealth: Payer: Self-pay

## 2024-11-29 NOTE — Telephone Encounter (Signed)
 No answer on follow-up call. Left VM for pt.
